# Patient Record
Sex: Female | Born: 1975 | Race: Black or African American | Hispanic: No | Marital: Single | Smoking: Never smoker
Health system: Southern US, Community
[De-identification: ages and names within clinical notes are randomized; demographics above are authoritative.]

## PROBLEM LIST (undated history)

## (undated) DIAGNOSIS — M549 Dorsalgia, unspecified: Secondary | ICD-10-CM

## (undated) DIAGNOSIS — C801 Malignant (primary) neoplasm, unspecified: Secondary | ICD-10-CM

## (undated) HISTORY — PX: LUMBAR DISC SURGERY: SHX700

## (undated) HISTORY — DX: Dorsalgia, unspecified: M54.9

## (undated) HISTORY — DX: Malignant (primary) neoplasm, unspecified: C80.1

## (undated) HISTORY — PX: PATELLAR TENDON REPAIR: SHX737

---

## 2018-05-26 ENCOUNTER — Encounter: Payer: Self-pay | Admitting: Physician Assistant

## 2018-05-26 ENCOUNTER — Ambulatory Visit (INDEPENDENT_AMBULATORY_CARE_PROVIDER_SITE_OTHER): Payer: BC Managed Care – PPO | Admitting: Physician Assistant

## 2018-05-26 VITALS — BP 114/76 | HR 54 | Wt 180.0 lb

## 2018-05-26 DIAGNOSIS — R300 Dysuria: Secondary | ICD-10-CM | POA: Diagnosis not present

## 2018-05-26 DIAGNOSIS — Z7689 Persons encountering health services in other specified circumstances: Secondary | ICD-10-CM | POA: Diagnosis not present

## 2018-05-26 DIAGNOSIS — Z111 Encounter for screening for respiratory tuberculosis: Secondary | ICD-10-CM

## 2018-05-26 DIAGNOSIS — N760 Acute vaginitis: Secondary | ICD-10-CM | POA: Diagnosis not present

## 2018-05-26 DIAGNOSIS — Z0000 Encounter for general adult medical examination without abnormal findings: Secondary | ICD-10-CM | POA: Diagnosis not present

## 2018-05-26 MED ORDER — METRONIDAZOLE 500 MG PO TABS: 500.0000 mg | ORAL_TABLET | Freq: Two times a day (BID) | ORAL | 0 refills | Status: AC

## 2018-05-26 MED ORDER — FLUCONAZOLE 150 MG PO TABS: 150.0000 mg | ORAL_TABLET | Freq: Once | ORAL | 0 refills | Status: AC

## 2018-05-26 MED ORDER — PHENAZOPYRIDINE HCL 200 MG PO TABS: 200.0000 mg | ORAL_TABLET | Freq: Three times a day (TID) | ORAL | 0 refills | Status: DC | PRN

## 2018-05-26 MED ORDER — FLUCONAZOLE 150 MG PO TABS: 150.0000 mg | ORAL_TABLET | Freq: Once | ORAL | 0 refills | Status: DC

## 2018-05-26 MED ORDER — METRONIDAZOLE 500 MG PO TABS: 500.0000 mg | ORAL_TABLET | Freq: Two times a day (BID) | ORAL | 0 refills | Status: DC

## 2018-05-26 NOTE — Patient Instructions (Signed)

## 2018-05-26 NOTE — Progress Notes (Signed)
HPI:                                                                Tammie Jones is a 42 y.o. female who presents to [facility]: Primary Care Sports Medicine today to establish care  Current Concerns include: vaginal discharge, employment physical  C/o malodorous vaginal discharge for several weeks. After self-swabbing in the bathroom today, she developed sudden onset dysuria and urinary urgency.  GYN/Sexual Health  Obstetrics: G0P0  Menstrual status: having periods, irregular  LMP: unkown  Last pap smear: 2 years ago, "normal"  History of abnormal pap smears: no  Sexually active: yes  Current contraception: same sex partner  History of STI: yes  Depression screen PHQ 2/9 05/26/2018  Decreased Interest 0  Down, Depressed, Hopeless 0  PHQ - 2 Score 0    Health Maintenance Health Maintenance  Topic Date Due  . HIV Screening  10/12/1988  . TETANUS/TDAP  10/12/1992  . PAP SMEAR  10/13/1994  . INFLUENZA VACCINE  03/10/2019 (Originally 03/09/2018)    Past Medical History:  Diagnosis Date  . Back pain    Past Surgical History:  Procedure Laterality Date  . LUMBAR DISC SURGERY     x2, L4-L5  . PATELLAR TENDON REPAIR Bilateral    Social History   Tobacco Use  . Smoking status: Never Smoker  . Smokeless tobacco: Never Used  Substance Use Topics  . Alcohol use: Yes    Alcohol/week: 2.0 - 3.0 standard drinks    Types: 2 - 3 Standard drinks or equivalent per week   family history is not on file.  ROS: negative except as noted in the HPI  Medications: No current outpatient medications on file.   No current facility-administered medications for this visit.    Not on File     Objective:  BP 114/76   Pulse (!) 54   Wt 180 lb (81.6 kg)  Gen:  alert, not ill-appearing, no distress, appropriate for age HEENT: head normocephalic without obvious abnormality, conjunctiva and cornea clear, trachea midline Pulm: Normal work of breathing,  normal phonation, clear to auscultation bilaterally, no wheezes, rales or rhonchi CV: bradycardic rate, regular rhythm, s1 and s2 distinct, no murmurs, clicks or rubs  Neuro: alert and oriented x 3, no tremor GU: vulva without rashes or lesions, normal introitus and urethral meatus, vaginal mucosa without erythema, moderate amount of watery clear discharge, cervix non-friable without lesions MSK: extremities atraumatic, normal gait and station Skin: intact, no rashes on exposed skin, no jaundice, no cyanosis Psych: well-groomed, cooperative, good eye contact, euthymic mood, affect mood-congruent, speech is articulate, and thought processes clear and goal-directed   A chaperone was used for the GU portion of the exam, Tammie Jones, CMA.    No results found for this or any previous visit (from the past 72 hour(s)). No results found.    Assessment and Plan: 42 y.o. female with   .Tammie Jones was seen today for establish care.  Diagnoses and all orders for this visit:  Encounter to establish care  Encounter for general health examination  Dysuria -     Discontinue: phenazopyridine (PYRIDIUM) 200 MG tablet; Take 1 tablet (200 mg total) by mouth 3 (three) times daily as needed for pain. -  Discontinue: phenazopyridine (PYRIDIUM) 200 MG tablet; Take 1 tablet (200 mg total) by mouth 3 (three) times daily as needed for pain. -     phenazopyridine (PYRIDIUM) 200 MG tablet; Take 1 tablet (200 mg total) by mouth 3 (three) times daily as needed for pain.  Acute vaginitis -     Discontinue: metroNIDAZOLE (FLAGYL) 500 MG tablet; Take 1 tablet (500 mg total) by mouth 2 (two) times daily for 7 days. -     Discontinue: fluconazole (DIFLUCAN) 150 MG tablet; Take 1 tablet (150 mg total) by mouth once for 1 dose. -     SureSwab, Vaginosis/Vaginitis Plus -     Discontinue: metroNIDAZOLE (FLAGYL) 500 MG tablet; Take 1 tablet (500 mg total) by mouth 2 (two) times daily for 7 days. -     Discontinue:  fluconazole (DIFLUCAN) 150 MG tablet; Take 1 tablet (150 mg total) by mouth once for 1 dose. -     fluconazole (DIFLUCAN) 150 MG tablet; Take 1 tablet (150 mg total) by mouth once for 1 dose. -     metroNIDAZOLE (FLAGYL) 500 MG tablet; Take 1 tablet (500 mg total) by mouth 2 (two) times daily for 7 days.  Screening-pulmonary TB -     TB Skin Test   - Personally reviewed PMH, PSH, PFH, medications, allergies, HM - Age-appropriate cancer screening: Pap UTD per patient - Influenza declined - Tdap UTD - PHQ2 negative - Employment physical completed, form signed and copy left in urgent care to complete TB reading in 48 hours  Dysura: new onset in office today after self-swabbing for vaginitis panel. I suspect she traumatized her urethra with the swab. Pyridium prn. F/u if symptoms persist or worsen  Acute vaginitis: treating empirically for BV/candida with Flagyl pending result of Sureswab vaginitis panel   Patient education and anticipatory guidance given Patient agrees with treatment plan Follow-up as needed  Levonne Hubert PA-C

## 2018-05-28 LAB — TB SKIN TEST
Induration: 0 mm
TB Skin Test: NEGATIVE

## 2018-05-30 ENCOUNTER — Encounter: Payer: Self-pay | Admitting: Physician Assistant

## 2018-05-30 DIAGNOSIS — B9689 Other specified bacterial agents as the cause of diseases classified elsewhere: Secondary | ICD-10-CM | POA: Insufficient documentation

## 2018-05-30 DIAGNOSIS — N760 Acute vaginitis: Secondary | ICD-10-CM

## 2018-05-30 LAB — SURESWAB, VAGINOSIS/VAGINITIS PLUS
ATOPOBIUM VAGINAE: 6.9 Log (cells/mL)
C. ALBICANS, DNA: NOT DETECTED
C. GLABRATA, DNA: NOT DETECTED
C. TRACHOMATIS RNA, TMA: NOT DETECTED
C. parapsilosis, DNA: NOT DETECTED
C. tropicalis, DNA: NOT DETECTED
GARDNERELLA VAGINALIS: 7.4 Log (cells/mL)
LACTOBACILLUS SPECIES: NOT DETECTED
MEGASPHAERA SPECIES: 7.7 Log (cells/mL)
N. gonorrhoeae RNA, TMA: NOT DETECTED
Trichomonas vaginalis RNA: NOT DETECTED

## 2018-06-09 ENCOUNTER — Encounter: Payer: Self-pay | Admitting: Physician Assistant

## 2018-06-09 DIAGNOSIS — Z111 Encounter for screening for respiratory tuberculosis: Secondary | ICD-10-CM | POA: Diagnosis not present

## 2018-10-08 HISTORY — PX: TOTAL ABDOMINAL HYSTERECTOMY: SHX209

## 2019-05-20 ENCOUNTER — Encounter: Payer: Self-pay | Admitting: Emergency Medicine

## 2019-05-20 ENCOUNTER — Emergency Department
Admission: EM | Admit: 2019-05-20 | Discharge: 2019-05-20 | Disposition: A | Discharge status: Home / Self Care | Payer: BC Managed Care – PPO | Source: Home / Self Care | Attending: Emergency Medicine | Admitting: Emergency Medicine

## 2019-05-20 ENCOUNTER — Emergency Department (INDEPENDENT_AMBULATORY_CARE_PROVIDER_SITE_OTHER): Payer: BC Managed Care – PPO

## 2019-05-20 ENCOUNTER — Other Ambulatory Visit: Payer: Self-pay

## 2019-05-20 DIAGNOSIS — M5412 Radiculopathy, cervical region: Secondary | ICD-10-CM | POA: Diagnosis not present

## 2019-05-20 DIAGNOSIS — M542 Cervicalgia: Secondary | ICD-10-CM | POA: Diagnosis not present

## 2019-05-20 DIAGNOSIS — M25512 Pain in left shoulder: Secondary | ICD-10-CM

## 2019-05-20 MED ORDER — IBUPROFEN 600 MG PO TABS
600.0000 mg | ORAL_TABLET | Freq: Once | ORAL | Status: AC
  Administered 2019-05-20: 600 mg via ORAL

## 2019-05-20 MED ORDER — PREDNISONE 20 MG PO TABS: ORAL_TABLET | ORAL | 0 refills | Status: DC

## 2019-05-20 NOTE — ED Triage Notes (Signed)
Here with left shoulder pain that started 4 days ago; progressively getting worse sharp, shooting pain radiating down arm/fingers. Tried gabapentin and Ibuprofen. No injury.

## 2019-05-20 NOTE — Discharge Instructions (Signed)
Take prednisone as instructed. Take Tylenol for breakthrough pain. Increase your gabapentin.  You have 100 mg tablets you can take 2 in the morning and 2 in the mid afternoon and continue 3 at night. Make an appointment to be seen by Dr. Dianah Field. Use a neck pillow for sleeping.

## 2019-05-20 NOTE — ED Provider Notes (Addendum)
[facility]    CSN: [id number] Arrival date & time: 05/20/19  1829      History   Chief Complaint Chief Complaint  Patient presents with  . Shoulder Pain    HPI Tammie Jones is a 43 y.o. female.  Complicated history in that patient presents with severe left-sided neck discomfort with radiation into the low left arm and numbness involving the middle and index  fingers.  Pertinent history reveals the patient has a history of ovarian cancer.  She had hysterectomy and oophorectomy followed by chemotherapy.  She works out regularly at [employer]  She does not know of a specific injury.  Her pain is currently 9 out of 10.  She does have discomfort with any movement of her back she also has tenderness into the superior portion of the left shoulder.  Shoulder Pain   Past Medical History:  Diagnosis Date  . Back pain     Patient Active Problem List   Diagnosis Date Noted  . Bacterial vaginosis 05/30/2018    Past Surgical History:  Procedure Laterality Date  . LUMBAR DISC SURGERY     x2, L4-L5  . PATELLAR TENDON REPAIR Bilateral   . TOTAL ABDOMINAL HYSTERECTOMY  10/2018    OB History   No obstetric history on file.      Home Medications    Prior to Admission medications   Medication Sig Start Date End Date Taking? Authorizing Provider  gabapentin (NEURONTIN) 300 MG capsule Take 300 mg by mouth 3 (three) times daily.   Yes [provider]  sertraline (ZOLOFT) 50 MG tablet Take 50 mg by mouth daily.   Yes [provider]  phenazopyridine (PYRIDIUM) 200 MG tablet Take 1 tablet (200 mg total) by mouth 3 (three) times daily as needed for pain. 05/26/18   Carlis Stable, PA-C  predniSONE (DELTASONE) 20 MG tablet Take 3 PO QAM x3days, 2 PO QAM x3days, 1 PO QAM x3days 05/20/19   Collene Gobble, MD    Family History No family history on file.  Social History Social History   Tobacco Use  . Smoking status: Never Smoker  .  Smokeless tobacco: Never Used  Substance Use Topics  . Alcohol use: Yes    Alcohol/week: 2.0 - 3.0 standard drinks    Types: 2 - 3 Standard drinks or equivalent per week  . Drug use: Never     Allergies   Patient has no known allergies.   Review of Systems Review of Systems  Constitutional: Negative.   HENT: Negative.   Respiratory: Negative.   Musculoskeletal:       She has severe discomfort left side of the neck and into the left shoulder and arm with numbness in the fingers of her left hand  Neurological:       She has numbness in the middle and index finger left hand     Physical Exam Triage Vital Signs ED Triage Vitals  Enc Vitals Group     BP 05/20/19 0837 (!) 145/85     Pulse Rate 05/20/19 0837 (!) 54     Resp --      Temp 05/20/19 0837 98.3 F (36.8 C)     Temp Source 05/20/19 0837 Oral     SpO2 05/20/19 0837 99 %     Weight 05/20/19 0840 164 lb (74.4 kg)     Height 05/20/19 0840 5\' 11"  (1.803 m)     Head Circumference --  Peak Flow --      Pain Score 05/20/19 0838 8     Pain Loc --      Pain Edu? --      Excl. in [place]? --    No data found.  Updated Vital Signs BP (!) 145/85 (BP Location: Left Arm)   Pulse (!) 54   Temp 98.3 F (36.8 C) (Oral)   Ht 5\' 11"  (1.803 m)   Wt 74.4 kg   SpO2 99%   BMI 22.87 kg/m   Visual Acuity Right Eye Distance:   Left Eye Distance:   Bilateral Distance:    Right Eye Near:   Left Eye Near:    Bilateral Near:     Physical Exam Constitutional:      Comments: Patient appears in distress with pain in left side of her neck holding her left arm above her head.  HENT:     Head: Normocephalic.  Neck:     Comments: There is decreased range of motion of the neck.  There is tenderness along the suprascapular area.  There is subjective decreased sensation index and middle finger left hand there is no definite weakness elicited.     UC Treatments / Results  Labs (all labs ordered are listed, but only abnormal  results are displayed) Labs Reviewed - No data to display  EKG   Radiology Dg Cervical Spine Complete  Result Date: 05/20/2019 CLINICAL DATA:  Patient states that for the past 3 days she has had left shoulder pain with pain radiating down her left arm. Denies injury. History of ovarian cancer left arm radiculopathy EXAM: CERVICAL SPINE - COMPLETE 4+ VIEW COMPARISON:  None. FINDINGS: No prevertebral soft tissue swelling. Normal alignment of the vertebral bodies. Normal spinal laminal line. Oblique projections demonstrate no traumatic narrowing of the neural foramina. Open mouth odontoid view demonstrates normal alignment of the lateral masses of C1 on C2. IMPRESSION: Negative cervical spine radiographs. Electronically Signed   By: Suzy Bouchard M.D.   On: 05/20/2019 09:39   Dg Shoulder Left  Result Date: 05/20/2019 CLINICAL DATA:  Acute left shoulder pain without known injury. EXAM: LEFT SHOULDER - 2+ VIEW COMPARISON:  None. FINDINGS: There is no evidence of fracture or dislocation. There is no evidence of arthropathy or other focal bone abnormality. Soft tissues are unremarkable. IMPRESSION: Negative. Electronically Signed   By: Marijo Conception M.D.   On: 05/20/2019 09:35    Procedures Procedures (including critical care time)  Medications Ordered in UC Medications  ibuprofen (ADVIL) tablet 600 mg (600 mg Oral Given 05/20/19 0846)    Initial Impression / Assessment and Plan / UC Course  I have reviewed the triage vital signs and the nursing notes. Patient has symptoms consistent with a left cervical radiculopathy.  Her history of ovarian cancer is concerning.  She does workout in the gym and may have had an injury there.  She was given 600 mg ibuprofen here.  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 Pertinent labs & imaging results that were available during my care of the patient were reviewed by me and considered in my medical decision  making (see chart for details). X-rays did not show any abnormalities we will treat as a cervical radiculopathy with follow-up with the orthopedist she will be treated with increased dose of Neurontin as well as a prednisone Dosepak with Tylenol for breakthrough pain.  Her pressure was up some but I suspect this  is secondary to the pain she is in.  I advised her to make an appointment as soon as possible to see Dr. Benjamin Stainhekkekandam for reevaluation.  She asked if I thought this [facility] related to her flu shot and I told her I felt it was more likely secondary to cervical radiculopathy.     Final Clinical Impressions(s) / UC Diagnoses   Final diagnoses:  Cervical radiculopathy     Discharge Instructions     Take prednisone as instructed. Take Tylenol for breakthrough pain. Increase your gabapentin.  You have 100 mg tablets you can take 2 in the morning and 2 in the mid afternoon and continue 3 at night. Make an appointment to be seen by Dr. Benjamin Stainhekkekandam. Use a neck pillow for sleeping.    ED Prescriptions    Medication Sig Dispense Auth. Provider   predniSONE (DELTASONE) 20 MG tablet Take 3 PO QAM x3days, 2 PO QAM x3days, 1 PO QAM x3days 18 tablet , Maylon Peppers A, MD     PDMP not reviewed this encounter.   Collene Gobbleaub,  A, MD 05/20/19 1002    Collene Gobbleaub,  A, MD 05/20/19 1003

## 2019-05-22 ENCOUNTER — Encounter: Payer: Self-pay | Admitting: Sports Medicine

## 2019-05-22 ENCOUNTER — Other Ambulatory Visit: Payer: Self-pay

## 2019-05-22 ENCOUNTER — Ambulatory Visit (INDEPENDENT_AMBULATORY_CARE_PROVIDER_SITE_OTHER): Payer: BC Managed Care – PPO | Admitting: Sports Medicine

## 2019-05-22 ENCOUNTER — Ambulatory Visit (INDEPENDENT_AMBULATORY_CARE_PROVIDER_SITE_OTHER): Payer: BC Managed Care – PPO

## 2019-05-22 DIAGNOSIS — M5412 Radiculopathy, cervical region: Secondary | ICD-10-CM

## 2019-05-22 MED ORDER — TRAMADOL HCL 50 MG PO TABS: 50.0000 mg | ORAL_TABLET | Freq: Three times a day (TID) | ORAL | 0 refills | Status: DC | PRN

## 2019-05-22 MED ORDER — GABAPENTIN 800 MG PO TABS: ORAL_TABLET | ORAL | 3 refills | Status: DC

## 2019-05-22 MED ORDER — AMITRIPTYLINE HCL 50 MG PO TABS: ORAL_TABLET | ORAL | 3 refills | Status: DC

## 2019-05-22 NOTE — Assessment & Plan Note (Signed)
Left C7 distribution radiculitis, history of ovarian cancer post chemotherapy. For this reason we are going to proceed sooner rather than later with a cervical spine MRI. She is finishing her prednisone, continue gabapentin at night, switching to 800 mg tablets, she can use it up to 3 times daily if needed. Adding amitriptyline at bedtime. Tramadol. Formal physical therapy. Return to see me in 6 weeks.

## 2019-05-22 NOTE — Progress Notes (Signed)
Subjective:    CC: Pain, left arm pain  HPI: This is a very pleasant 43 year old female, she just finished her last chemotherapy session for ovarian cancer, stage I.  Unfortunately she has noted for the past several months pain in her neck with radiation down the left arm to the second and third fingers, better with abduction of the shoulder.  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  I reviewed the past medical history, family history, social history, surgical history, and allergies today and no changes were needed.  Please see the problem list section below in epic for further details.  Past Medical History: Past Medical History:  Diagnosis Date  . Back pain    Past Surgical History: Past Surgical History:  Procedure Laterality Date  . LUMBAR DISC SURGERY     x2, L4-L5  . PATELLAR TENDON REPAIR Bilateral   . TOTAL ABDOMINAL HYSTERECTOMY  10/2018   Social History: Social History   Socioeconomic History  . Marital status: Single    Spouse name: Not on file  . Number of children: Not on file  . Years of education: Not on file  . Highest education level: Not on file  Occupational History  . Not on file  Social Needs  . Financial resource strain: Not on file  . Food insecurity    Worry: Not on file    Inability: Not on file  . Transportation needs    Medical: Not on file    Non-medical: Not on file  Tobacco Use  . Smoking status: Never Smoker  . Smokeless tobacco: Never Used  Substance and Sexual Activity  . Alcohol use: Yes    Alcohol/week: 2.0 - 3.0 standard drinks    Types: 2 - 3 Standard drinks or equivalent per week  . Drug use: Never  . Sexual activity: Yes    Birth control/protection: Other-see comments    Comment: same sex partner  Lifestyle  . Physical activity    Days per week: Not on file    Minutes per session: Not on file  . Stress: Not on file   Relationships  . Social Musician on phone: Not on file    Gets together: Not on file    Attends religious service: Not on file    Active member of club or organization: Not on file    Attends meetings of clubs or organizations: Not on file    Relationship status: Not on file  Other Topics Concern  . Not on file  Social History Narrative  . Not on file   Family History: No family history on file. Allergies: No Known Allergies Medications: See med rec.  Review of Systems: No fevers, chills, night sweats, weight loss, chest pain, or shortness of breath.   Objective:    General: Well Developed, well nourished, and in no acute distress.  Neuro: Alert and oriented x3, extra-ocular muscles intact, sensation grossly intact.  HEENT: Normocephalic, atraumatic, pupils equal round reactive to light, neck supple, no masses, no lymphadenopathy, thyroid nonpalpable.  Skin: Warm and dry, no rashes. Cardiac: Regular rate and rhythm, no murmurs rubs or gallops, no lower extremity edema.  Respiratory: Clear to auscultation bilaterally. Not using accessory muscles, speaking in full sentences. Neck: Negative spurling's Full neck range of motion Grip strength and sensation normal in bilateral hands Strength good C4 to T1 distribution No sensory change to C4 to T1 Reflexes  normal  Impression and Recommendations:    Radiculitis of left cervical region Left C7 distribution radiculitis, history of ovarian cancer post chemotherapy. For this reason we are going to proceed sooner rather than later with a cervical spine MRI. She is finishing her prednisone, continue gabapentin at night, switching to 800 mg tablets, she can use it up to 3 times daily if needed. Adding amitriptyline at bedtime. Tramadol. Formal physical therapy. Return to see me in 6 weeks.   ___________________________________________ Gwen Her. Dianah Field, M.D., ABFM., CAQSM. Primary Care and Sports Medicine [facility]  Adjunct Professor of [employer]

## 2019-05-29 ENCOUNTER — Other Ambulatory Visit: Payer: Self-pay

## 2019-05-29 ENCOUNTER — Encounter: Payer: Self-pay | Admitting: Physical Therapy

## 2019-05-29 ENCOUNTER — Ambulatory Visit (INDEPENDENT_AMBULATORY_CARE_PROVIDER_SITE_OTHER): Payer: BC Managed Care – PPO | Admitting: Physical Therapy

## 2019-05-29 DIAGNOSIS — R293 Abnormal posture: Secondary | ICD-10-CM

## 2019-05-29 DIAGNOSIS — M5412 Radiculopathy, cervical region: Secondary | ICD-10-CM | POA: Diagnosis not present

## 2019-05-29 NOTE — Patient Instructions (Signed)
Access Code: [email or web address]  URL: [email or web address]  Date: 05/29/2019  Prepared by: Faustino Congress   Exercises  Seated Upper Trapezius Stretch - 3 reps - 1 sets - 30 sec hold - 2x daily - 7x weekly  Seated Levator Scapulae Stretch - 1 reps - 1 sets - 30 sec hold - 2x daily - 7x weekly  Seated Cervical Retraction and Extension - 10 reps - 1 sets - 5-10 sec hold - 3x daily - 7x weekly  Patient Education  Trigger Point Dry Needling

## 2019-05-29 NOTE — Therapy (Signed)
[facility] [address] [place], [place], [address] Phone: [phone]   Fax:  [phone]  Physical Therapy Evaluation  Patient Details  Name: Tammie Jones MRN: [id number] Date of Birth: 10/19/1975 Referring Provider (PT): Monica Bectonhekkekandam, Thomas J, MD   Encounter Date: 05/29/2019  PT End of Session - 05/29/19 1245    Visit Number  1    Number of Visits  12    Date for PT Re-Evaluation  07/10/19    PT Start Time  1100    PT Stop Time  1144    PT Time Calculation (min)  44 min    Activity Tolerance  Patient tolerated treatment well    Behavior During Therapy  [facility] for [facility] assessed/performed       Past Medical History:  Diagnosis Date  . Back pain     Past Surgical History:  Procedure Laterality Date  . LUMBAR DISC SURGERY     x2, L4-L5  . PATELLAR TENDON REPAIR Bilateral   . TOTAL ABDOMINAL HYSTERECTOMY  10/2018    There were no vitals filed for this visit.   Subjective Assessment - 05/29/19 1103    Subjective  Pt is a 43 y/o female who presents to OPPT for Lt sided neck and shoulder pain x 1.5 weeks.  Pt states she got flu shot on Monday; and symptoms developed following.  Pt reports she developed progressive pain into LUE with tingling into Lt fingers.  Pt also feels Rt shoulder/neck has started to develop symptoms as well.    Patient Stated Goals  return to working out, be able to feel index and middle fingers again    Currently in Pain?  Yes    Pain Score  4    up to 10/10; at best 0/10   Pain Location  Neck    Pain Orientation  Left    Pain Descriptors / Indicators  Constant;Radiating;Numbness;Spasm;Tightness    Pain Type  Acute pain    Pain Radiating Towards  arm, forearm, fingers    Pain Onset  1 to 4 weeks ago    Pain Frequency  Constant    Aggravating Factors   unknown; turning to Rt    Pain Relieving Factors  rest, staying stationary         [facility] - 05/29/19 1109      Assessment   Medical Diagnosis  M54.12 (ICD-10-CM) - Radiculitis of left cervical region    Referring Provider (PT)  Monica Bectonhekkekandam, Thomas J, MD    Onset Date/Surgical Date  05/17/19    Hand Dominance  Right    Next MD Visit  07/03/2019    Prior Therapy  for low back (2 back surgeries)      Precautions   Precautions  None      Restrictions   Weight Bearing Restrictions  No      Balance Screen   Has the patient fallen in the past 6 months  No    Has the patient had a decrease in activity level because of a fear of falling?   No    Is the patient reluctant to leave their home because of a fear of falling?   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)    Leisure  exercise (lifting, biking, walking); Engineer, structural      Cognition   Overall Cognitive Status  Within Functional Limits for tasks assessed      Posture/Postural Control   Posture/Postural Control  Postural limitations    Postural Limitations  Rounded Shoulders;Forward head      ROM / Strength   AROM / PROM / Strength  AROM;Strength      AROM   AROM Assessment Site  Cervical    Cervical Flexion  37   with pain   Cervical Extension  39    Cervical - Right Side Bend  38   with radiating pain LUE   Cervical - Left Side Bend  26   pain worse than right   Cervical - Right Rotation  75    Cervical - Left Rotation  56   with pain     Strength   Strength Assessment Site  Shoulder    Right/Left Shoulder  Right;Left    Right Shoulder Flexion  5/5    Right Shoulder ABduction  5/5    Right Shoulder Internal Rotation  5/5    Right Shoulder External Rotation  5/5    Left Shoulder Flexion  3+/5    Left Shoulder ABduction  4/5    Left Shoulder Internal Rotation  5/5    Left Shoulder  External Rotation  5/5    Right/Left Elbow  --      Palpation   Palpation comment  active trigger points Lt levator scapula/upper trap      Special Tests    Special Tests  Cervical    Cervical Tests  Spurling's;Dictraction      Spurling's   Findings  Positive    Side  Left      Distraction Test   Findngs  Negative                Objective measurements completed on examination: See above findings.      [facility] Adult PT Treatment/Exercise - 05/29/19 1109      Self-Care   Self-Care  Other Self-Care Comments    Other Self-Care Comments   instructed in HEP: upper trap/levator scapula stretch; and cervical retraction/extension x 10 reps      Manual Therapy   Manual Therapy  Soft tissue mobilization    Soft tissue mobilization  STM to bil upper traps into levator scapulae       Trigger Point Dry Needling - 05/29/19 1244    Consent Given?  Yes    Education Handout Provided  Yes    Muscles Treated Head and Neck  Upper trapezius;Levator scapulae    Upper Trapezius Response  Twitch reponse elicited;Palpable increased muscle length    Levator Scapulae Response  Twitch response elicited;Palpable increased muscle length           PT Education - 05/29/19 1245    Education Details  HEP, DN    Person(s) Educated  Patient    Methods  Explanation;Demonstration;Handout    Comprehension  Verbalized understanding;Returned demonstration;Need further instruction          PT Long Term Goals - 05/29/19 1252      PT LONG TERM GOAL #1   Title  independent with HEP    Status  New    Target Date  07/10/19      PT LONG TERM GOAL #2   Title  improve Lt cervical  rotation to at least 65 degrees without pain for improved function    Status  New    Target Date  07/10/19      PT LONG TERM GOAL #3   Title  demonstrate Lt shoulder flexion 5/5 for improved strength and function    Status  New    Target Date  07/10/19      PT LONG TERM GOAL #4   Title  report pain < 4/10  with activity for improved function    Status  New    Target Date  07/10/19             Plan - 05/29/19 1245    Clinical Impression Statement  Pt is a 43 y/o female who presents to OPPT for acute onset of Lt sided neck and shoulder pain.  Pt demonstrates postural abnormalities, decreased strength, and active trigger points affecting functional mobility.  Pt will benefit from PT to address deficits listed.    Personal Factors and Comorbidities  Comorbidity 2    Comorbidities  lumbar disc surgery x 2; abdominal hysterectomy (just completed tx for ovarian cancer)    Examination-Activity Limitations  Lift;Reach Overhead    Examination-Participation Restrictions  Driving;Other   exercise   Stability/Clinical Decision Making  Evolving/Moderate complexity    Clinical Decision Making  Moderate    Rehab Potential  Good    PT Frequency  2x / week    PT Duration  6 weeks    PT Treatment/Interventions  ADLs/Self Care Home Management;Cryotherapy;Ultrasound;Traction;Moist Heat;Iontophoresis 4mg /ml Dexamethasone;Functional mobility training;Therapeutic activities;Therapeutic exercise;Patient/family education;Manual techniques;Passive range of motion;Taping;Dry needling    PT Next Visit Plan  review HEP, assess response to DN, continue manual/DN (no estim-recent cancer), posture exercises; needs FOTO with goal written    PT Home Exercise Plan  Access Code: [email or web address]       Patient will benefit from skilled therapeutic intervention in order to improve the following deficits and impairments:  Decreased range of motion, Increased fascial restricitons, Increased muscle spasms, Pain, Postural dysfunction, Decreased strength  Visit Diagnosis: Radiculopathy, cervical region - Plan: PT plan of care cert/re-cert  Abnormal posture - Plan: PT plan of care cert/re-cert     Problem List Patient Active Problem List   Diagnosis Date Noted  . Radiculitis of left cervical region 05/22/2019  . Bacterial  vaginosis 05/30/2018      06/01/2018, PT, DPT 05/29/19 12:55 PM      [facility] [address] [place] [address] [place], [place], [place] Phone: [phone]   Fax:  [phone]  Name: Tammie Jones MRN: Tammie Jones Date of Birth: 15-Aug-1975

## 2019-05-31 ENCOUNTER — Ambulatory Visit (INDEPENDENT_AMBULATORY_CARE_PROVIDER_SITE_OTHER): Payer: BC Managed Care – PPO | Admitting: Physical Therapy

## 2019-05-31 ENCOUNTER — Other Ambulatory Visit: Payer: Self-pay

## 2019-05-31 ENCOUNTER — Encounter: Payer: Self-pay | Admitting: Physical Therapy

## 2019-05-31 DIAGNOSIS — M5412 Radiculopathy, cervical region: Secondary | ICD-10-CM

## 2019-05-31 DIAGNOSIS — R293 Abnormal posture: Secondary | ICD-10-CM | POA: Diagnosis not present

## 2019-05-31 NOTE — Therapy (Signed)
[place], [place], [address] Phone: [phone]   Fax:  [phone]  Physical Therapy Treatment  Patient Details  Name: Tammie Jones MRN: [id number] Date of Birth: 1976/06/07 Referring Provider (PT): Silverio Decamp, MD   Encounter Date: 05/31/2019  PT End of Session - 05/31/19 1143    Visit Number  2    Number of Visits  12    Date for PT Re-Evaluation  07/10/19    PT Start Time  1100    PT Stop Time  1140    PT Time Calculation (min)  40 min    Activity Tolerance  Patient tolerated treatment well    Behavior During Therapy  [facility] for tasks assessed/performed       Past Medical History:  Diagnosis Date  . Back pain     Past Surgical History:  Procedure Laterality Date  . LUMBAR DISC SURGERY     x2, L4-L5  . PATELLAR TENDON REPAIR Bilateral   . TOTAL ABDOMINAL HYSTERECTOMY  10/2018    There were no vitals filed for this visit.  Subjective Assessment - 05/31/19 1105    Subjective  symptoms eased up after first session but then started to return as activity increased.    Patient Stated Goals  return to working out, be able to feel index and middle fingers again    Currently in Pain?  Yes    Pain Score  2     Pain Location  Arm    Pain Orientation  Left    Pain Descriptors / Indicators  Radiating    Pain Type  Acute pain    Pain Onset  1 to 4 weeks ago    Pain Frequency  Constant    Aggravating Factors   turning to Rt    Pain Relieving Factors  rest, staying stationary                       [facility] Adult PT Treatment/Exercise - 05/31/19 1107      Exercises   Exercises  Neck      Neck Exercises: Machines for Strengthening   UBE (Upper Arm Bike)  L4 x 4 min (2' each direction)      Neck Exercises: Seated   Neck Retraction  10 reps;10 secs    W Back  10 reps   10 sec     Manual Therapy   Manual Therapy  Soft tissue mobilization    Soft tissue  mobilization  STM to bil upper traps into levator scapulae, cervical paraspinals      Neck Exercises: Stretches   Upper Trapezius Stretch  Left;1 rep;30 seconds    Levator Stretch  Left;1 rep;30 seconds       Trigger Point Dry Needling - 05/31/19 1142    Consent Given?  Yes    Education Handout Provided  Previously provided    Muscles Treated Head and Neck  Upper trapezius;Levator scapulae;Splenius capitus;Semispinalis capitus;Cervical multifidi    Upper Trapezius Response  Twitch reponse elicited;Palpable increased muscle length    Levator Scapulae Response  Twitch response elicited;Palpable increased muscle length    Splenius capitus Response  Twitch reponse elicited;Palpable increased muscle length    Semispinalis capitus Response  Twitch reponse elicited;Palpable increased muscle length    Cervical multifidi Response  Twitch reponse elicited;Palpable increased muscle length                PT Long Term Goals -  05/29/19 1252      PT LONG TERM GOAL #1   Title  independent with HEP    Status  New    Target Date  07/10/19      PT LONG TERM GOAL #2   Title  improve Lt cervical rotation to at least 65 degrees without pain for improved function    Status  New    Target Date  07/10/19      PT LONG TERM GOAL #3   Title  demonstrate Lt shoulder flexion 5/5 for improved strength and function    Status  New    Target Date  07/10/19      PT LONG TERM GOAL #4   Title  report pain < 4/10 with activity for improved function    Status  New    Target Date  07/10/19            Plan - 05/31/19 1143    Clinical Impression Statement  Pt tolerated session well today with decreased numbness following DN and manual therapy today.  Also discussed posterior shoulder/upper back strengthening exercises for home with weights.  Pt verbalized understanding.    Personal Factors and Comorbidities  Comorbidity 2    Comorbidities  lumbar disc surgery x 2; abdominal hysterectomy (just  completed tx for ovarian cancer)    Examination-Activity Limitations  Lift;Reach Overhead    Examination-Participation Restrictions  Driving;Other   exercise   Stability/Clinical Decision Making  Evolving/Moderate complexity    Rehab Potential  Good    PT Frequency  2x / week    PT Duration  6 weeks    PT Treatment/Interventions  ADLs/Self Care Home Management;Cryotherapy;Ultrasound;Traction;Moist Heat;Iontophoresis 4mg /ml Dexamethasone;Functional mobility training;Therapeutic activities;Therapeutic exercise;Patient/family education;Manual techniques;Passive range of motion;Taping;Dry needling    PT Next Visit Plan  review HEP, assess response to DN, continue manual/DN (no estim-recent cancer), posture exercises; needs FOTO with goal written    PT Home Exercise Plan  Access Code: [email or web address]       Patient will benefit from skilled therapeutic intervention in order to improve the following deficits and impairments:  Decreased range of motion, Increased fascial restricitons, Increased muscle spasms, Pain, Postural dysfunction, Decreased strength  Visit Diagnosis: Radiculopathy, cervical region  Abnormal posture     Problem List Patient Active Problem List   Diagnosis Date Noted  . Radiculitis of left cervical region 05/22/2019  . Bacterial vaginosis 05/30/2018      06/01/2018, PT, DPT 05/31/19 11:45 AM     [facility] [address] [place] [address] [place], [place], [place] Phone: [phone]   Fax:  [phone]  Name: Tammie Jones MRN: Tammie Jones Date of Birth: 03/08/1976

## 2019-06-05 ENCOUNTER — Other Ambulatory Visit: Payer: Self-pay

## 2019-06-05 ENCOUNTER — Ambulatory Visit (INDEPENDENT_AMBULATORY_CARE_PROVIDER_SITE_OTHER): Payer: BC Managed Care – PPO | Admitting: Physical Therapy

## 2019-06-05 DIAGNOSIS — R293 Abnormal posture: Secondary | ICD-10-CM

## 2019-06-05 DIAGNOSIS — M5412 Radiculopathy, cervical region: Secondary | ICD-10-CM

## 2019-06-05 NOTE — Therapy (Signed)
[facility] [place] [address] [place] [address] [place], [place], [address] Phone: [phone]   Fax:  [phone]  Physical Therapy Treatment  Patient Details  Name: Tammie Jones MRN: [id number] Date of Birth: 06/26/1976 Referring Provider (PT): Monica Bectonhekkekandam, Thomas J, MD   Encounter Date: 06/05/2019  PT End of Session - 06/05/19 1238    Visit Number  3    Number of Visits  12    Date for PT Re-Evaluation  07/10/19    PT Start Time  1150    PT Stop Time  1234    PT Time Calculation (min)  44 min    Behavior During Therapy  [facility] for [facility] assessed/performed       Past Medical History:  Diagnosis Date  . Back pain     Past Surgical History:  Procedure Laterality Date  . LUMBAR DISC SURGERY     x2, L4-L5  . PATELLAR TENDON REPAIR Bilateral   . TOTAL ABDOMINAL HYSTERECTOMY  10/2018    There were no vitals filed for this visit.  Subjective Assessment - 06/05/19 1252    Subjective  Pt reports she had relief of tingling in Lt hand for a few days following last session; symptoms returned after 3 days.  She tried to show her neice an incline push up and she couldn't do it; her Lt shoulder just felt weak.    Patient Stated Goals  return to working out, be able to feel index and middle fingers again    Currently in Pain?  Yes    Pain Score  2     Pain Location  Neck   upper trap   Pain Orientation  Left    Pain Descriptors / Indicators  Aching;Nagging    Pain Radiating Towards  down arm and into index and middle L finger    Aggravating Factors   turning head Lt    Pain Relieving Factors  rest.         [facility] PT Assessment - 06/05/19 0001      Assessment   Medical Diagnosis  M54.12 (ICD-10-CM) - Radiculitis of left cervical region    Referring Provider (PT)  Monica Bectonhekkekandam, Thomas J, MD    Onset Date/Surgical Date  05/17/19    Hand Dominance  Right    Next MD Visit  07/03/2019    Prior Therapy  for low back (2 back surgeries)      AROM   Cervical Flexion  57    Cervical Extension  60    Cervical - Right Side Bend  40    Cervical - Left Side Bend  34        [facility] Adult PT Treatment/Exercise - 06/05/19 0001      Self-Care   Self-Care  Other Self-Care Comments    Other Self-Care Comments   Pt instructed in self massage for Lt pec, upper trap and posterior shoulder girdle with ball; pt returned demo with cues.       Neck Exercises: Machines for Strengthening   UBE (Upper Arm Bike)  L3: 1 min each direction       Neck Exercises: Standing   Other Standing Exercises  scap retraction x 5 sec x 5 reps, mirror for feedback on posture and symmetry of shoulders.       Neck Exercises: Supine   Other Supine Exercise  median nerve glides for LUE x 10     Other Supine Exercise  Lt arm snow angel x 5 (with prolonged  stretch ~120 deg)       Manual Therapy   Manual Therapy  Soft tissue mobilization    Soft tissue mobilization  STM to Lt pec, upper trap, and bilat cervical paraspinals (reproduced symptoms into UE) - performed in supine;  switched to seated position and IASTM to Lt UT, scalenes, levator scapulae, pec, posterior shoulder      Neck Exercises: Stretches   Upper Trapezius Stretch  --   verbally reviewed.    Other Neck Stretches  3 position doorway stretch x 3 reps of 15 sec; bilat bicep stretch holding door frame x 2 reps of 20 sec;  Lt tricep stretch x 20 sec              PT Education - 06/05/19 1314    Education Details  HEP    Person(s) Educated  Patient    Methods  Explanation;Demonstration;Handout;Verbal cues    Comprehension  Returned demonstration;Verbalized understanding          PT Long Term Goals - 05/29/19 1252      PT LONG TERM GOAL #1   Title  independent with HEP    Status  New    Target Date  07/10/19      PT LONG TERM GOAL #2   Title  improve Lt cervical rotation to at least 65 degrees without pain for improved function    Status  New    Target Date  07/10/19      PT LONG  TERM GOAL #3   Title  demonstrate Lt shoulder flexion 5/5 for improved strength and function    Status  New    Target Date  07/10/19      PT LONG TERM GOAL #4   Title  report pain < 4/10 with activity for improved function    Status  New    Target Date  07/10/19            Plan - 06/05/19 1240    Clinical Impression Statement  Pt had positive response to DN last session, but radicular symptoms returned 3 days later.  Pt reported mild decrease in symptoms in hand after doorway stretch.  Trial at [facility] to [facility], cervical paraspinals and scalenes reproduced and increased symptoms into Lt hand.  Improved tolerance for IASTM to upper trap, lower cervical paraspinals, and pec.  Pt reported reduction of neck pain after IASTM.  Cervical ROM improved since eval.  Goals are ongoing.    Personal Factors and Comorbidities  Comorbidity 2    Comorbidities  lumbar disc surgery x 2; abdominal hysterectomy (just completed tx for ovarian cancer)    Examination-Activity Limitations  Lift;Reach Overhead    Examination-Participation Restrictions  Driving;Other   exercise   Stability/Clinical Decision Making  Evolving/Moderate complexity    Rehab Potential  Good    PT Frequency  2x / week    PT Duration  6 weeks    PT Treatment/Interventions  ADLs/Self Care Home Management;Cryotherapy;Ultrasound;Traction;Moist Heat;Iontophoresis 4mg /ml Dexamethasone;Functional mobility training;Therapeutic activities;Therapeutic exercise;Patient/family education;Manual techniques;Passive range of motion;Taping;Dry needling    PT Next Visit Plan  continue manual/DN (no estim-recent cancer), continue posture strengthening exercises; needs FOTO with goal written    PT Home Exercise Plan  Access Code: [email or web address]    Consulted and Agree with Plan of Care  Patient       Patient will benefit from skilled therapeutic intervention in order to improve the following deficits and impairments:  Decreased range of motion, Increased  fascial restricitons, Increased  muscle spasms, Pain, Postural dysfunction, Decreased strength  Visit Diagnosis: Radiculopathy, cervical region  Abnormal posture     Problem List Patient Active Problem List   Diagnosis Date Noted  . Radiculitis of left cervical region 05/22/2019  . Bacterial vaginosis 05/30/2018   Kerin Perna, PTA 06/05/19 2:01 PM  [place], [place], [address] Phone: [phone]   Fax:  [phone]  Name: Tammie Jones MRN: [id number] Date of Birth: [birth year]

## 2019-06-05 NOTE — Patient Instructions (Addendum)
Neurovascular: Median Nerve Glide With Elbow Bias - Supine    Lie with neck supported. Hold right arm out to side, elbow bent, thumb down, fingers and wrist bent back. Slowly straighten elbow as far as possible without pain. Repeat __10__ times per set. Do ___1 sets per session. Do __5__ sessions per week.  Access Code: [email or web address]  URL: [email or web address]  Date: 06/05/2019  Prepared by: Kerin Perna   Exercises  Added: Doorway Pec Stretch at 90 Degrees Abduction - 5 reps - 1 sets - 2x daily - 7x weekly  Doorway Pec Stretch at 120 Degrees Abduction - 5 reps - 1 sets - 15-20 hold - 2x daily - 7x weekly  Standing Scapular Retraction - 5 reps - 1 sets - 5-10 hold - 3x daily - 7x weekly

## 2019-06-07 ENCOUNTER — Encounter: Payer: BC Managed Care – PPO | Admitting: Physical Therapy

## 2019-06-08 ENCOUNTER — Encounter: Payer: Self-pay | Admitting: Rehabilitative and Restorative Service Providers"

## 2019-06-08 ENCOUNTER — Other Ambulatory Visit: Payer: Self-pay

## 2019-06-08 ENCOUNTER — Ambulatory Visit (INDEPENDENT_AMBULATORY_CARE_PROVIDER_SITE_OTHER): Payer: BC Managed Care – PPO | Admitting: Rehabilitative and Restorative Service Providers"

## 2019-06-08 DIAGNOSIS — R293 Abnormal posture: Secondary | ICD-10-CM | POA: Diagnosis not present

## 2019-06-08 DIAGNOSIS — M5412 Radiculopathy, cervical region: Secondary | ICD-10-CM | POA: Diagnosis not present

## 2019-06-08 NOTE — Therapy (Signed)
[facility] [address] [place], [place], [address] Phone: [phone]   Fax:  [phone]  Physical Therapy Treatment  Patient Details  Name: Tammie Jones MRN: [id number] Date of Birth: 10/05/1975 Referring Provider (PT): Monica Bectonhekkekandam, Thomas J, MD   Encounter Date: 06/08/2019  PT End of Session - 06/08/19 1049    Visit Number  4    Number of Visits  12    Date for PT Re-Evaluation  07/10/19    PT Start Time  1022    PT Stop Time  1110    PT Time Calculation (min)  48 min    Behavior During Therapy  [facility] for [facility] assessed/performed       Past Medical History:  Diagnosis Date  . Back pain     Past Surgical History:  Procedure Laterality Date  . LUMBAR DISC SURGERY     x2, L4-L5  . PATELLAR TENDON REPAIR Bilateral   . TOTAL ABDOMINAL HYSTERECTOMY  10/2018    There were no vitals filed for this visit.  Subjective Assessment - 06/08/19 1024    Subjective  The patient reports on Wednesday her pain worsened into her left arm.  She reports things felt really good after leaving here after her last session.  Due to feeling good, she was able to pick her niece up, which seems to have aggravated her symptoms.    Pertinent History  h/o ovarian cancer    Patient Stated Goals  return to working out, be able to feel index and middle fingers again    Currently in Pain?  Yes    Pain Score  5     Pain Location  Neck    Pain Orientation  Left    Pain Descriptors / Indicators  Aching;Nagging    Pain Type  Acute pain    Pain Radiating Towards  arm into forearm (not into fingers today)    Pain Onset  More than a month ago    Pain Frequency  Constant    Aggravating Factors   working out, turning head left                       [facility] Adult PT Treatment/Exercise - 06/08/19 1339      Exercises   Exercises  Neck      Neck Exercises: Sidelying   Other Sidelying Exercise  PT combined sidelying (head  supported) neck retraction with L UE reaching to floor (positioned with L shoulder / UE off top edge of mat table.  Used this for neck stabilization with active nerve glide.      Neck Exercises: Prone   Neck Retraction  5 reps    Neck Retraction Limitations  prone on elbows with tactile cues for technique      Modalities   Modalities  Cryotherapy      Cryotherapy   Number Minutes Cryotherapy  12 Minutes    Cryotherapy Location  Cervical;Shoulder   left   Type of Cryotherapy  Ice pack      Manual Therapy   Manual Therapy  Joint mobilization;Soft tissue mobilization;Manual Traction;Passive ROM;Scapular mobilization    Manual therapy comments  For goal of pain reduction, centralization of radiating symptoms, improving flexibility.    Joint Mobilization  sidelying:  Grade I and Grade II lateral mid cervical glides, Supine: grade I P>A mobs mid c- spine, Prone: grade I and II P>A upper thoracic mobilization     Soft tissue mobilization  STM to L scalenes, paraspinals in mid, lower, and upper thoracic spine, L pectoralis musculature.  STM at insertion of levator on L scapula and along ligaments at tranverse process T1    Scapular Mobilization  Sidelyign L scapular mobilization moving into protraction/retraction; adding STM in sidelying to upper trap L side using contract/relax for reduced tightness    Passive ROM  PROM improved after STM and gentle manual traction to left rotation without radiating symptoms    Manual Traction  gentle manual traction supine             PT Education - 06/08/19 1338    Education Details  Recommended use of ice for muscle soreness    Person(s) Educated  Patient    Methods  Explanation    Comprehension  Verbalized understanding          PT Long Term Goals - 05/29/19 1252      PT LONG TERM GOAL #1   Title  independent with HEP    Status  New    Target Date  07/10/19      PT LONG TERM GOAL #2   Title  improve Lt cervical rotation to at least 65  degrees without pain for improved function    Status  New    Target Date  07/10/19      PT LONG TERM GOAL #3   Title  demonstrate Lt shoulder flexion 5/5 for improved strength and function    Status  New    Target Date  07/10/19      PT LONG TERM GOAL #4   Title  report pain < 4/10 with activity for improved function    Status  New    Target Date  07/10/19            Plan - 06/08/19 1101    Clinical Impression Statement  The patient had trigger point at L levator and was point tender along musculature at transverse process T1.  She had an increase in pain with trigger point release along parascapular muscles and upper thoracic paraspinal musculature.  She had a decrease in radiating symptoms.  PT ended with ice to reduce inflammation.  She was encouraged to ice later today to reduce muscle soreness.  Ended session with 3/10 pain and no radiating symptoms.    Personal Factors and Comorbidities  Comorbidity 2    Comorbidities  lumbar disc surgery x 2; abdominal hysterectomy (just completed tx for ovarian cancer)    Examination-Activity Limitations  Lift;Reach Overhead    Examination-Participation Restrictions  Driving;Other   exercise   Stability/Clinical Decision Making  Evolving/Moderate complexity    Rehab Potential  Good    PT Frequency  2x / week    PT Duration  6 weeks    PT Treatment/Interventions  ADLs/Self Care Home Management;Cryotherapy;Ultrasound;Traction;Moist Heat;Iontophoresis 4mg /ml Dexamethasone;Functional mobility training;Therapeutic activities;Therapeutic exercise;Patient/family education;Manual techniques;Passive range of motion;Taping;Dry needling    PT Next Visit Plan  continue manual/DN (no estim-recent cancer), continue posture strengthening exercises    PT Home Exercise Plan  Access Code: [email or web address]    Consulted and Agree with Plan of Care  Patient       Patient will benefit from skilled therapeutic intervention in order to improve the following deficits  and impairments:  Decreased range of motion, Increased fascial restricitons, Increased muscle spasms, Pain, Postural dysfunction, Decreased strength  Visit Diagnosis: Radiculopathy, cervical region  Abnormal posture     Problem List Patient Active Problem List   Diagnosis Date Noted  .  Radiculitis of left cervical region 05/22/2019  . Bacterial vaginosis 05/30/2018    Oral Hallgren, PT 06/08/2019, 1:47 PM  [facility] [address] [place], [place], [address] Phone: [phone]   Fax:  [phone]  Name: Tammie Jones MRN: [id number] Date of Birth: [birth year]

## 2019-06-11 ENCOUNTER — Ambulatory Visit (INDEPENDENT_AMBULATORY_CARE_PROVIDER_SITE_OTHER): Payer: BC Managed Care – PPO | Admitting: Physical Therapy

## 2019-06-11 ENCOUNTER — Encounter: Payer: Self-pay | Admitting: Physical Therapy

## 2019-06-11 ENCOUNTER — Other Ambulatory Visit: Payer: Self-pay

## 2019-06-11 DIAGNOSIS — M5412 Radiculopathy, cervical region: Secondary | ICD-10-CM | POA: Diagnosis not present

## 2019-06-11 DIAGNOSIS — R293 Abnormal posture: Secondary | ICD-10-CM

## 2019-06-11 NOTE — Therapy (Signed)
[place], [place], [address] Phone: [phone]   Fax:  [phone]  Physical Therapy Treatment  Patient Details  Name: Tammie Jones MRN: [id number] Date of Birth: [birth year] Referring Provider (PT): Tammie Decamp, MD   Encounter Date: 06/11/2019  PT End of Session - 06/11/19 1102    Visit Number  5    Number of Visits  12    Date for PT Re-Evaluation  07/10/19    PT Start Time  1102    PT Stop Time  1146    PT Time Calculation (min)  44 min    Activity Tolerance  Patient tolerated treatment well    Behavior During Therapy  [facility] for tasks assessed/performed       Past Medical History:  Diagnosis Date  . Back pain     Past Surgical History:  Procedure Laterality Date  . LUMBAR DISC SURGERY     x2, L4-L5  . PATELLAR TENDON REPAIR Bilateral   . TOTAL ABDOMINAL HYSTERECTOMY  10/2018    There were no vitals filed for this visit.  Subjective Assessment - 06/11/19 1103    Subjective  Patient worked out this morning. Tingling was in and out over the weekend and then bothered last night. Tingling now in digit 2 and tip of digit 3. Only tightness with left cervical rotation. Gives discomfor 0.5/10.    Pertinent History  h/o ovarian cancer    Patient Stated Goals  return to working out, be able to feel index and middle fingers again    Currently in Pain?  No/denies                       [facility] Adult PT Treatment/Exercise - 06/11/19 0001      Manual Therapy   Manual Therapy  Soft tissue mobilization    Manual therapy comments  skilled palpation and monitoring of soft tissue during DN    Soft tissue mobilization  to muscles surrounding left shoulder girdle, left cervical and thoracic paraspinals       Trigger Point Dry Needling - 06/11/19 0001    Consent Given?  Yes    Education Handout Provided  Previously provided    Muscles Treated Head and Neck  Upper  trapezius;Levator scapulae;Cervical multifidi    Muscles Treated Upper Quadrant  Rhomboids;Infraspinatus;Subscapularis;Latissimus dorsi;Teres major;Triceps    Other Dry Needling  T1-3 left mulitfidi    Upper Trapezius Response  Twitch reponse elicited;Palpable increased muscle length    Levator Scapulae Response  Twitch response elicited;Palpable increased muscle length    Cervical multifidi Response  Twitch reponse elicited;Palpable increased muscle length    Rhomboids Response  Twitch response elicited;Palpable increased muscle length    Infraspinatus Response  Twitch response elicited;Palpable increased muscle length    Latissimus dorsi Response  Twitch response elicited;Palpable increased muscle length    Teres major Response  Twitch response elicited;Palpable increased muscle length    Triceps Response  Twitch response elicited;Palpable increased muscle length                PT Long Term Goals - 05/29/19 1252      PT LONG TERM GOAL #1   Title  independent with HEP    Status  New    Target Date  07/10/19      PT LONG TERM GOAL #2   Title  improve Lt cervical rotation to at least 65 degrees without pain for improved function  Status  New    Target Date  07/10/19      PT LONG TERM GOAL #3   Title  demonstrate Lt shoulder flexion 5/5 for improved strength and function    Status  New    Target Date  07/10/19      PT LONG TERM GOAL #4   Title  report pain < 4/10 with activity for improved function    Status  New    Target Date  07/10/19            Plan - 06/11/19 1148    Clinical Impression Statement  Patient responded very well to DN in cervical and upper thoracic multifidi as well as in left triceps. Decreased tissue tension noted throughout left upper quadrant following treatment today.    PT Treatment/Interventions  ADLs/Self Care Home Management;Cryotherapy;Ultrasound;Traction;Moist Heat;Iontophoresis 4mg /ml Dexamethasone;Functional mobility  training;Therapeutic activities;Therapeutic exercise;Patient/family education;Manual techniques;Passive range of motion;Taping;Dry needling    PT Next Visit Plan  continue manual/DN (no estim-recent cancer), continue posture strengthening exercises    PT Home Exercise Plan  Access Code: [email or web address]       Patient will benefit from skilled therapeutic intervention in order to improve the following deficits and impairments:  Decreased range of motion, Increased fascial restricitons, Increased muscle spasms, Pain, Postural dysfunction, Decreased strength  Visit Diagnosis: Radiculopathy, cervical region  Abnormal posture     Problem List Patient Active Problem List   Diagnosis Date Noted  . Radiculitis of left cervical region 05/22/2019  . Bacterial vaginosis 05/30/2018    06/01/2018 PT 06/11/2019, 1:22 PM  [facility] [address] [place], [place], [place] Phone: [phone]   Fax:  [phone]  Name: Tammie Jones MRN: Tammie Jones Date of Birth: April 03, 1976

## 2019-06-14 ENCOUNTER — Encounter: Payer: BC Managed Care – PPO | Admitting: Physical Therapy

## 2019-06-15 ENCOUNTER — Ambulatory Visit (INDEPENDENT_AMBULATORY_CARE_PROVIDER_SITE_OTHER): Payer: BC Managed Care – PPO | Admitting: Physical Therapy

## 2019-06-15 ENCOUNTER — Other Ambulatory Visit: Payer: Self-pay

## 2019-06-15 DIAGNOSIS — M5412 Radiculopathy, cervical region: Secondary | ICD-10-CM | POA: Diagnosis not present

## 2019-06-15 DIAGNOSIS — R293 Abnormal posture: Secondary | ICD-10-CM

## 2019-06-15 NOTE — Patient Instructions (Signed)
Access Code: [email or web address]  URL: [email or web address]  Date: 06/15/2019  Prepared by: Kerin Perna   Exercises  added Standing Shoulder Flexion with Resistance - 10 reps - 1-2 sets - 1x daily - 5x weekly  Standing Single Arm Shoulder Abduction with Resistance - 10 reps - 1-2 sets - 1x daily - 5x weekly  Split Stance Shoulder Row with Resistance - 10 reps - 1-2 sets - 1x daily - 5x weekly

## 2019-06-15 NOTE — Therapy (Signed)
[place] [place], [place], [address] Phone: [phone]   Fax:  [phone]  Physical Therapy Treatment  Patient Details  Name: Tammie Jones MRN: [id number] Date of Birth: 28-Feb-1976 Referring Provider (PT): Silverio Decamp, MD   Encounter Date: 06/15/2019  PT End of Session - 06/15/19 0931    Visit Number  6    Number of Visits  12    Date for PT Re-Evaluation  07/10/19    PT Start Time  0932    PT Stop Time  1012    PT Time Calculation (min)  40 min    Activity Tolerance  Patient tolerated treatment well    Behavior During Therapy  [facility] for tasks assessed/performed       Past Medical History:  Diagnosis Date  . Back pain     Past Surgical History:  Procedure Laterality Date  . LUMBAR DISC SURGERY     x2, L4-L5  . PATELLAR TENDON REPAIR Bilateral   . TOTAL ABDOMINAL HYSTERECTOMY  10/2018    There were no vitals filed for this visit.  Subjective Assessment - 06/15/19 0932    Subjective  Pt worked out this morning. She didn't have any issues pain wise but she still has numbness in her finger tips and tightness in her shoulder. She had some pain and muscle twitching after DN; subsided after a few days.    Currently in Pain?  No/denies - rates tingling in hand as 5/10.         [facility] PT Assessment - 06/15/19 0001      Assessment   Medical Diagnosis  M54.12 (ICD-10-CM) - Radiculitis of left cervical region    Referring Provider (PT)  Silverio Decamp, MD    Onset Date/Surgical Date  05/17/19    Hand Dominance  Right    Next MD Visit  07/03/2019    Prior Therapy  for low back (2 back surgeries)      AROM   Cervical - Right Rotation  70    Cervical - Left Rotation  69      Strength   Left Shoulder Flexion  4+/5   Simultaneous filing. User may not have seen previous data.   Left Shoulder ABduction  --   5-/5 Simultaneous filing. User may not have seen previous data.       [facility] Adult PT Treatment/Exercise - 06/15/19 0001      Self-Care   Other Self-Care Comments   Pt encouraged to hold off on UE exercises outside therapy, except ones given in session, to avoid continued irritation of nerves in UE; pt verbalized understanding      Neck Exercises: Machines for Strengthening   UBE (Upper Arm Bike)  L1 x 1 min forward/backward      Shoulder Exercises: Standing   External Rotation Limitations  trial, one rep with green band - increased tingling in finger; stopped    Flexion  Strengthening;Left;12 reps    Theraband Level (Shoulder Flexion)  Level 2 (Red)    ABduction  Strengthening;Left;10 reps;15 reps    Theraband Level (Shoulder ABduction)  Level 3 (Green)    Row  Strengthening;Both;15 reps    Theraband Level (Shoulder Row)  Level 3 (Green)      Modalities   Modalities  --   declined; will use ice at home.      Manual Therapy   Manual Therapy  Soft tissue mobilization;Taping    Soft tissue mobilization  IASTM  to Lt cervical paraspinals, scalenes, upper trap, levator, infraspinatus, wrist extensors - decrease fascial restrictions     Kinesiotex  IT sales professional  I strip of sensitive skin applied along superficial back arm line with 15% stretch, and perpendicular strips with 50% stretch at levator, infraspinatus, mid tricep, and lateral epicondyle      Neck Exercises: Stretches   Upper Trapezius Stretch  Right;Left;2 reps;20 seconds    Other Neck Stretches  3 position doorway stretch x 1 rep of 20 sec of each; bilat bicep stretch holding door frame x 1 rep of 20 sec;  Lt/Rt tricep stretch x 2 reps of 20 sec              PT Education - 06/15/19 1024    Education Details  HEP, issued red and green band.    Person(s) Educated  Patient    Methods  Explanation;Demonstration;Verbal cues   pt declined handout   Comprehension  Verbalized understanding;Returned demonstration          PT Long Term Goals - 06/15/19  1030      PT LONG TERM GOAL #1   Title  independent with HEP    Status  On-going      PT LONG TERM GOAL #2   Title  improve Lt cervical rotation to at least 65 degrees without pain for improved function    Status  Achieved      PT LONG TERM GOAL #3   Title  demonstrate Lt shoulder flexion 5/5 for improved strength and function    Status  On-going      PT LONG TERM GOAL #4   Title  report pain < 4/10 with activity for improved function    Status  On-going            Plan - 06/15/19 1024    Clinical Impression Statement  Continued positive response to DN last session with reduced nerve symptoms in UE for 2 days.  Pt has reported feeling so well, she forgets there is an issue then may over do it with exercises.  Encouraged pt to hold off on upper body exercises (except HEP) to assist in irritation of symptoms. Pt reported reduction of radicular symptoms from 5/10 to 3/10 after IASTM.  Pt demonstrated improved shoulder strength and cervical rotation; has met LTG#2. Making good gains towards remaining goals.    Rehab Potential  Good    PT Frequency  2x / week    PT Duration  6 weeks    PT Treatment/Interventions  ADLs/Self Care Home Management;Cryotherapy;Ultrasound;Traction;Moist Heat;Iontophoresis 37m/ml Dexamethasone;Functional mobility training;Therapeutic activities;Therapeutic exercise;Patient/family education;Manual techniques;Passive range of motion;Taping;Dry needling    PT Next Visit Plan  continue manual/DN (no estim-recent cancer), continue posture strengthening exercises.  [facility].    PT Home Exercise Plan  Access Code: [email or web address]    Consulted and Agree with Plan of Care  Patient       Patient will benefit from skilled therapeutic intervention in order to improve the following deficits and impairments:  Decreased range of motion, Increased fascial restricitons, Increased muscle spasms, Pain, Postural dysfunction, Decreased strength  Visit  Diagnosis: Radiculopathy, cervical region  Abnormal posture     Problem List Patient Active Problem List   Diagnosis Date Noted  . Radiculitis of left cervical region 05/22/2019  . Bacterial vaginosis 05/30/2018   JKerin Perna PTA 06/15/19 10:59 AM  [place] [phone]  [place], [place], [address] Phone: [phone]   Fax:  [phone]  Name: Tammie Jones MRN: [id number] Date of Birth: 1976/08/01

## 2019-06-18 ENCOUNTER — Ambulatory Visit (INDEPENDENT_AMBULATORY_CARE_PROVIDER_SITE_OTHER): Payer: BC Managed Care – PPO | Admitting: Rehabilitative and Restorative Service Providers"

## 2019-06-18 ENCOUNTER — Encounter: Payer: BC Managed Care – PPO | Admitting: Physical Therapy

## 2019-06-18 ENCOUNTER — Other Ambulatory Visit: Payer: Self-pay

## 2019-06-18 DIAGNOSIS — R293 Abnormal posture: Secondary | ICD-10-CM

## 2019-06-18 DIAGNOSIS — M5412 Radiculopathy, cervical region: Secondary | ICD-10-CM | POA: Diagnosis not present

## 2019-06-18 NOTE — Patient Instructions (Signed)
Access Code: [email or web address]  URL: [email or web address]  Date: 06/18/2019  Prepared by: Rudell Cobb   Exercises Seated Upper Trapezius Stretch - 3 reps - 1 sets - 30 sec hold - 2x daily - 7x weekly Seated Levator Scapulae Stretch - 1 reps - 1 sets - 30 sec hold - 2x daily - 7x weekly First Rib Mobilization with Strap - 10 reps - 3 sets - 1x daily - 7x weekly Doorway Pec Stretch at 90 Degrees Abduction - 5 reps - 1 sets - 2x daily - 7x weekly Doorway Pec Stretch at 120 Degrees Abduction - 5 reps - 1 sets - 15-20 hold - 2x daily - 7x weekly Standing Scapular Retraction - 15 reps - 1-2 sets - 1x daily - 5x weekly Standing Shoulder Flexion with Resistance - 10 reps - 1-2 sets - 1x daily - 5x weekly Standing Single Arm Shoulder Abduction with Resistance - 10 reps - 1-2 sets - 1x daily - 5x weekly Split Stance Shoulder Row with Resistance - 10 reps - 1-2 sets - 1x daily - 5x weekly

## 2019-06-18 NOTE — Therapy (Signed)
[facility] [place] [address] [place], [place], [address] Phone: [phone]   Fax:  [phone]  Physical Therapy Treatment  Patient Details  Name: Tammie Jones MRN: [id number] Date of Birth: July 31, 1976 Referring Provider (PT): Monica Becton, MD   Encounter Date: 06/18/2019  PT End of Session - 06/18/19 0848    Visit Number  7    Number of Visits  12    Date for PT Re-Evaluation  07/10/19    PT Start Time  0845    PT Stop Time  0930    PT Time Calculation (min)  45 min    Activity Tolerance  Patient tolerated treatment well    Behavior During Therapy  [facility] for tasks assessed/performed       Past Medical History:  Diagnosis Date  . Back pain     Past Surgical History:  Procedure Laterality Date  . LUMBAR DISC SURGERY     x2, L4-L5  . PATELLAR TENDON REPAIR Bilateral   . TOTAL ABDOMINAL HYSTERECTOMY  10/2018    There were no vitals filed for this visit.  Subjective Assessment - 06/18/19 0846    Subjective  The patient got pins and needles sensation x hours yesterday in first 2 digits.  She feels that L shoulder pain has resolved, she is continuing with tingling in fingers.  The tape helped last session.  Sheis no longer having painful points in posterior aspect of L arm.    Pertinent History  h/o ovarian cancer    Patient Stated Goals  return to working out, be able to feel index and middle fingers again    Currently in Pain?  No/denies                       [facility] Adult PT Treatment/Exercise - 06/18/19 0854      Exercises   Exercises  Neck;Shoulder      Neck Exercises: Machines for Strengthening   UBE (Upper Arm Bike)  L2 x 1.5 minutes forward/backwards      Neck Exercises: Theraband   Rows  10 reps;Blue    Rows Limitations  Standing with band above level of head for pull down initially with elbow extended, then with elbow flexed.    Other Theraband Exercises  Facing away from  doorframe using blue band and pulling down anteriorly with L shoulder      Neck Exercises: Seated   Other Seated Exercise  First rib depression uisng belt to stabilize left shoulder and tilt away from belt.      Neck Exercises: Prone   Rows  10 reps;Weights    Rows Weights (lbs)  2 lbs    Upper Extremity Flexion with Stabilization  Flexion;10 reps    UE Flexion with Stabilization Limitations  2 lb doing overhead "superman" exercise with resistance cuing patinet to reduce shoulder elevation    Other Prone Exercise  Shoulder press ups x 10 reps with cues for upper thoracic stretch.    Other Prone Exercise  --      Manual Therapy   Manual Therapy  Joint mobilization;Soft tissue mobilization;Myofascial release;Manual Traction;Neural Stretch    Manual therapy comments  Goal of pain reduction, reduce muscle tightness    Joint Mobilization  Prone:  P>A upper to mid thoracic grade II-III    Soft tissue mobilization  STM L upper trap, L levator, L rhomboids and L paraspinal (c-spine) musculature in R sidelying    Myofascial Release  suboccipital release    Manual Traction  gentle manual traction supine with passive overpressure/ stretch in all planes    Neural Stretch  supine neural gliding with wrist flexion/extension and head rotation to right side for greater stretch.             PT Education - 06/18/19 [phone]    Education Details  Added L first rib mobilization stretch for positioning to current HEP    Person(s) Educated  Patient    Methods  Explanation;Demonstration;Handout    Comprehension  Returned demonstration;Verbalized understanding          PT Long Term Goals - 06/15/19 1030      PT LONG TERM GOAL #1   Title  independent with HEP    Status  On-going      PT LONG TERM GOAL #2   Title  improve Lt cervical rotation to at least 65 degrees without pain for improved function    Status  Achieved      PT LONG TERM GOAL #3   Title  demonstrate Lt shoulder flexion 5/5 for  improved strength and function    Status  On-going      PT LONG TERM GOAL #4   Title  report pain < 4/10 with activity for improved function    Status  On-going            Plan - 06/18/19 0944    Clinical Impression Statement  The patient is having decreased proximal pain, but does note continued numbness in distal tips of 2 fingers.  She is limiting UE exercise except for prescribed PT routine.  We worked on strengthening with band today with tolerance to activities.  She continues to make progress to LTGs.    PT Treatment/Interventions  ADLs/Self Care Home Management;Cryotherapy;Ultrasound;Traction;Moist Heat;Iontophoresis 4mg /ml Dexamethasone;Functional mobility training;Therapeutic activities;Therapeutic exercise;Patient/family education;Manual techniques;Passive range of motion;Taping;Dry needling    PT Next Visit Plan  continue manual/DN (no estim-recent cancer), continue posture strengthening exercises.  Assess response to [facility] tape application.    PT Home Exercise Plan  Access Code: [email or web address]    Consulted and Agree with Plan of Care  Patient       Patient will benefit from skilled therapeutic intervention in order to improve the following deficits and impairments:  Decreased range of motion, Increased fascial restricitons, Increased muscle spasms, Pain, Postural dysfunction, Decreased strength  Visit Diagnosis: Abnormal posture  Radiculopathy, cervical region     Problem List Patient Active Problem List   Diagnosis Date Noted  . Radiculitis of left cervical region 05/22/2019  . Bacterial vaginosis 05/30/2018    Kade Demicco , PT 06/18/2019, 9:46 AM  [facility] [place], [place], [address] Phone: [phone]   Fax:  [phone]  Name: Tammie Jones MRN: [id number] Date of Birth: [birth year]

## 2019-06-21 ENCOUNTER — Other Ambulatory Visit: Payer: Self-pay

## 2019-06-21 ENCOUNTER — Encounter: Payer: Self-pay | Admitting: Physical Therapy

## 2019-06-21 ENCOUNTER — Ambulatory Visit (INDEPENDENT_AMBULATORY_CARE_PROVIDER_SITE_OTHER): Payer: BC Managed Care – PPO | Admitting: Physical Therapy

## 2019-06-21 DIAGNOSIS — R293 Abnormal posture: Secondary | ICD-10-CM | POA: Diagnosis not present

## 2019-06-21 DIAGNOSIS — M5412 Radiculopathy, cervical region: Secondary | ICD-10-CM | POA: Diagnosis not present

## 2019-06-21 NOTE — Patient Instructions (Signed)
Access Code: [email or web address]  URL: [email or web address]  Date: 06/21/2019  Prepared by: Almyra Free Chadrick Sprinkle   Exercises Seated Upper Trapezius Stretch - 3 reps - 1 sets - 30 sec hold - 2x daily - 7x weekly Seated Levator Scapulae Stretch - 1 reps - 1 sets - 30 sec hold - 2x daily - 7x weekly First Rib Mobilization with Strap - 10 reps - 3 sets - 1x daily - 7x weekly Doorway Pec Stretch at 90 Degrees Abduction - 5 reps - 1 sets - 2x daily - 7x weekly Doorway Pec Stretch at 120 Degrees Abduction - 5 reps - 1 sets - 15-20 hold - 2x daily - 7x weekly Standing Scapular Retraction - 15 reps - 1-2 sets - 1x daily - 5x weekly Standing Shoulder Flexion with Resistance - 10 reps - 1-2 sets - 1x daily - 5x weekly Standing Single Arm Shoulder Abduction with Resistance - 10 reps - 1-2 sets - 1x daily - 5x weekly Split Stance Shoulder Row with Resistance - 10 reps - 1-2 sets - 1x daily - 5x weekly Sidelying Shoulder External Rotation with Dumbbell - 10 reps - 3 sets - 1x daily - 7x weekly Prone Single Arm Shoulder Horizontal Abduction with Dumbbell - Palm Down - 10 reps - 3 sets - 1x daily - 7x weekly Prone Single Arm Shoulder Y with Dumbbell - 10 reps - 3 sets - 1x daily - 7x weekly

## 2019-06-21 NOTE — Therapy (Signed)
[place], [place], [address] Phone: [phone]   Fax:  [phone]  Physical Therapy Treatment  Patient Details  Name: Tammie Jones MRN: [id number] Date of Birth: 11-14-1975 Referring Provider (PT): Silverio Decamp, MD   Encounter Date: 06/21/2019  PT End of Session - 06/21/19 1149    Visit Number  8    Number of Visits  12    Date for PT Re-Evaluation  07/10/19    PT Start Time  7425    PT Stop Time  9563    PT Time Calculation (min)  46 min    Activity Tolerance  Patient tolerated treatment well;Other (comment)   became lightheaded after DN   Behavior During Therapy  WFL for tasks assessed/performed       Past Medical History:  Diagnosis Date  . Back pain     Past Surgical History:  Procedure Laterality Date  . LUMBAR DISC SURGERY     x2, L4-L5  . PATELLAR TENDON REPAIR Bilateral   . TOTAL ABDOMINAL HYSTERECTOMY  10/2018    There were no vitals filed for this visit.  Subjective Assessment - 06/21/19 1149    Subjective  no pain but still having numbness in tip of digit 3 and entire digit 2. Tape felt good.    Patient Stated Goals  return to working out, be able to feel index and middle fingers again    Currently in Pain?  No/denies                       [facility] Adult PT Treatment/Exercise - 06/21/19 0001      Neck Exercises: Machines for Strengthening   UBE (Upper Arm Bike)  L2 x 1.5 minutes forward/backwards    Lat Pull  2 plates x 10; 3 plates x 10 with eccentric return      Neck Exercises: Theraband   Rows  10 reps;Blue      Shoulder Exercises: Supine   Protraction  20 reps;Weights    Protraction Weight (lbs)  5      Shoulder Exercises: Prone   Extension  Left;Weights;20 reps    Extension Weight (lbs)  1 and 2# 10 ea    Horizontal ABduction 1  Left;10 reps    Horizontal ABduction 1 Weight (lbs)  1    Horizontal ABduction 2  Left;10 reps;Weights     Horizontal ABduction 2 Weight (lbs)  1      Shoulder Exercises: Sidelying   External Rotation  Left;20 reps;Weights    External Rotation Weight (lbs)  2    External Rotation Limitations  fatigues easily      Shoulder Exercises: Standing   Protraction Limitations  attempted with band but poor stabilization    External Rotation Limitations  red band x 5 winging in scapula      Manual Therapy   Manual Therapy  Soft tissue mobilization    Manual therapy comments  skilled palpation and monitoring of soft tissue during DN     Soft tissue mobilization  to left lats/infraspinatus       Trigger Point Dry Needling - 06/21/19 0001    Consent Given?  Yes    Education Handout Provided  Previously provided    Muscles Treated Head and Neck  Upper trapezius    Muscles Treated Upper Quadrant  Infraspinatus;Latissimus dorsi    Levator Scapulae Response  Twitch response elicited;Palpable increased muscle length    Infraspinatus Response  Twitch response elicited;Palpable increased muscle length           PT Education - 06/21/19 1243    Education Details  HEP progressed    Person(s) Educated  Patient    Methods  Explanation;Demonstration;Handout    Comprehension  Verbalized understanding;Returned demonstration          PT Long Term Goals - 06/15/19 1030      PT LONG TERM GOAL #1   Title  independent with HEP    Status  On-going      PT LONG TERM GOAL #2   Title  improve Lt cervical rotation to at least 65 degrees without pain for improved function    Status  Achieved      PT LONG TERM GOAL #3   Title  demonstrate Lt shoulder flexion 5/5 for improved strength and function    Status  On-going      PT LONG TERM GOAL #4   Title  report pain < 4/10 with activity for improved function    Status  On-going            Plan - 06/21/19 1243    Clinical Impression Statement  Pt demonstrating significant winging with ER today so worked on protraction, horizontal ABD and  scaption in prone where pt was very weak. Continued c/o tingling in digits 2 and 3 which may be originating from infraspinatus tightness.  Patient had excellent response to DN in left lats and IS today. She did feel lightheaded afterward but this resolved after sitting for a couple of minutes. LTGs ongoing.    Comorbidities  lumbar disc surgery x 2; abdominal hysterectomy (just completed tx for ovarian cancer)    PT Treatment/Interventions  ADLs/Self Care Home Management;Cryotherapy;Ultrasound;Traction;Moist Heat;Iontophoresis 4mg /ml Dexamethasone;Functional mobility training;Therapeutic activities;Therapeutic exercise;Patient/family education;Manual techniques;Passive range of motion;Taping;Dry needling    PT Next Visit Plan  Focus on RC strengthening, mid/low traps and serratus. Try quadriped position for serratus. Assess DN response; (no estim-recent cancer),.    PT Home Exercise Plan  Access Code: [email or web address]    Consulted and Agree with Plan of Care  Patient       Patient will benefit from skilled therapeutic intervention in order to improve the following deficits and impairments:  Decreased range of motion, Increased fascial restricitons, Increased muscle spasms, Pain, Postural dysfunction, Decreased strength  Visit Diagnosis: Abnormal posture  Radiculopathy, cervical region     Problem List Patient Active Problem List   Diagnosis Date Noted  . Radiculitis of left cervical region 05/22/2019  . Bacterial vaginosis 05/30/2018    06/01/2018 PT 06/21/2019, 1:00 PM  [facility] [address] [place], [place], [place] Phone: [phone]   Fax:  [phone]  Name: Tammie Jones MRN: [name] Date of Birth: 04-18-1976

## 2019-06-25 ENCOUNTER — Ambulatory Visit (INDEPENDENT_AMBULATORY_CARE_PROVIDER_SITE_OTHER): Payer: BC Managed Care – PPO | Admitting: Physical Therapy

## 2019-06-25 ENCOUNTER — Encounter: Payer: BC Managed Care – PPO | Admitting: Physical Therapy

## 2019-06-25 ENCOUNTER — Other Ambulatory Visit: Payer: Self-pay

## 2019-06-25 DIAGNOSIS — M5412 Radiculopathy, cervical region: Secondary | ICD-10-CM | POA: Diagnosis not present

## 2019-06-25 DIAGNOSIS — R293 Abnormal posture: Secondary | ICD-10-CM

## 2019-06-25 NOTE — Therapy (Addendum)
[place]  [place], [place], [address] Phone: [phone]   Fax:  [phone]  Physical Therapy Treatment And Discharge Summary   Patient Details  Name: Tammie Jones MRN: [id number] Date of Birth: November 08, 1975 Referring Provider (PT): Silverio Decamp, MD   Encounter Date: 06/25/2019  PT End of Session - 06/25/19 0856    Visit Number  9    Number of Visits  12    Date for PT Re-Evaluation  07/10/19    PT Start Time  2595   pt arrived late   PT Stop Time  0931    PT Time Calculation (min)  38 min    Activity Tolerance  Patient tolerated treatment well;Other (comment)   became lightheaded after DN   Behavior During Therapy  WFL for tasks assessed/performed       Past Medical History:  Diagnosis Date  . Back pain     Past Surgical History:  Procedure Laterality Date  . LUMBAR DISC SURGERY     x2, L4-L5  . PATELLAR TENDON REPAIR Bilateral   . TOTAL ABDOMINAL HYSTERECTOMY  10/2018    There were no vitals filed for this visit.  Subjective Assessment - 06/25/19 0857    Subjective  Hasn't had pain in 1.5 wks, however numbness remains constant in first and middle finger of Lt hand. She wonders if chemo plays a role in the numbness. She has numbness in feet that began during chemo.    Patient Stated Goals  return to working out, be able to feel index and middle fingers again    Currently in Pain?  No/denies    Pain Score  0-No pain         [facility] PT Assessment - 06/25/19 0001      Assessment   Medical Diagnosis  M54.12 (ICD-10-CM) - Radiculitis of left cervical region    Referring Provider (PT)  Silverio Decamp, MD    Onset Date/Surgical Date  05/17/19    Hand Dominance  Right    Next MD Visit  07/03/2019    Prior Therapy  for low back (2 back surgeries)        [facility] Adult PT Treatment/Exercise - 06/25/19 0001      Shoulder Exercises: Supine   Protraction  Strengthening;Left;10  reps    Protraction Weight (lbs)  5   slow, eccentric lowering   Diagonals  Strengthening;Left;10 reps;Theraband    Theraband Level (Shoulder Diagonals)  Level 3 (Green)      Shoulder Exercises: Prone   Extension  Strengthening;Left;10 reps    Extension Weight (lbs)  2    Horizontal ABduction 1  Left;10 reps   T's   Horizontal ABduction 1 Weight (lbs)  1, 2   5 reps with 1#, 10 with 2# and 3 sec hold   Horizontal ABduction 2  Left;10 reps   Y's   Horizontal ABduction 2 Weight (lbs)  1      Shoulder Exercises: Sidelying   External Rotation  Strengthening;Left;12 reps    External Rotation Weight (lbs)  2    External Rotation Limitations  fatigues easily, reports fatigue in bicep      Shoulder Exercises: Stretch   Other Shoulder Stretches  3 position doorway stretch x 20 sec x 2 reps each position; bilat bicep stretch x 20 sec x 1 reps      Manual Therapy   Soft tissue mobilization  IASTM to Lt cervical paraspinals, upper trap, Lt ant and  posterior shoulder to decrease fascial restrictions.      Neural Stretch  standing LUE neural stretch with palm on wall and turning body away x 3 reps (limited tolerance), Lt median/radial nerve glides x 5 reps in stranding      Neck Exercises: Stretches   Upper Trapezius Stretch  Right;2 reps;10 seconds    Other Neck Stretches  self- MFR with lateral flexion and rotation/lateral flexion to stretch SCM and scalenes on Lt.                    PT Long Term Goals - 06/25/19 1222      PT LONG TERM GOAL #1   Title  independent with HEP    Status  On-going      PT LONG TERM GOAL #2   Title  improve Lt cervical rotation to at least 65 degrees without pain for improved function    Status  Achieved      PT LONG TERM GOAL #3   Title  demonstrate Lt shoulder flexion 5/5 for improved strength and function    Status  On-going      PT LONG TERM GOAL #4   Title  report pain < 4/10 with activity for improved function    Status  Achieved             Plan - 06/25/19 1059    Clinical Impression Statement  Pt now pain free in neck and LUE, however radicular symptoms into Lt first and middle finger remain constant regardless of positioning, posture, or exercises. She has met LTG #4. After discussion with supervising PT, Madelyn Flavors, Pt agreeable to hold therapy until MD appt.     Comorbidities  lumbar disc surgery x 2; abdominal hysterectomy (just completed tx for ovarian cancer)    PT Frequency  2x / week    PT Duration  6 weeks    PT Treatment/Interventions  ADLs/Self Care Home Management;Cryotherapy;Ultrasound;Traction;Moist Heat;Iontophoresis 70m/ml Dexamethasone;Functional mobility training;Therapeutic activities;Therapeutic exercise;Patient/family education;Manual techniques;Passive range of motion;Taping;Dry needling    PT Next Visit Plan  spoke with supervising PT; will hold until MD appt; pt to continue HEP. Will await further direction from MD.    PT Home Exercise Plan  Access Code: [email or web address]    Consulted and Agree with Plan of Care  Patient       Patient will benefit from skilled therapeutic intervention in order to improve the following deficits and impairments:  Decreased range of motion, Increased fascial restricitons, Increased muscle spasms, Pain, Postural dysfunction, Decreased strength  Visit Diagnosis: Abnormal posture  Radiculopathy, cervical region     Problem List Patient Active Problem List   Diagnosis Date Noted  . Radiculitis of left cervical region 05/22/2019  . Bacterial vaginosis 05/30/2018   JKerin Perna PTA 06/25/19 12:24 PM  [place] [address]: [phone]  Fax:  [phone] Name: Tammie ToporMRN: [id number] of Birth: 3Jul 06, 1977 PHYSICAL THERAPY DISCHARGE SUMMARY  Visits from Start of Care: 9  Current functional level related to goals / functional  outcomes: See Above   Remaining deficits: See Above   Education / Equipment: HEP  Plan: Patient agrees to discharge.  Patient goals were partially met. Patient is being discharged due to a change in medical status.  ?????Patient having epidural.    JMadelyn Flavors PT 07/12/19 5:39 PM  [facility] [address]  [place], [place] [address]  [phone] (office) [phone] (fax)

## 2019-06-28 ENCOUNTER — Encounter: Payer: BC Managed Care – PPO | Admitting: Physical Therapy

## 2019-06-29 ENCOUNTER — Encounter: Payer: Self-pay | Admitting: Sports Medicine

## 2019-06-29 ENCOUNTER — Other Ambulatory Visit: Payer: Self-pay

## 2019-06-29 ENCOUNTER — Ambulatory Visit (INDEPENDENT_AMBULATORY_CARE_PROVIDER_SITE_OTHER): Payer: BC Managed Care – PPO | Admitting: Sports Medicine

## 2019-06-29 DIAGNOSIS — M5412 Radiculopathy, cervical region: Secondary | ICD-10-CM | POA: Diagnosis not present

## 2019-06-29 NOTE — Assessment & Plan Note (Signed)
Left C7 distribution radiculitis, confirmed with MRI. She is much better with physical therapy, tramadol, amitriptyline, gabapentin. Still with persistent paresthesias on the left index and middle fingers, proceeding with a left C6-C7 interlaminar epidural. Return to see me 1 month after the injection to evaluate response.

## 2019-06-29 NOTE — Progress Notes (Signed)
Subjective:    CC: Follow-up  HPI: Lance Bosch returns, she is a pleasant 43 year old female with left C7 distribution radiculitis, improved considerably, pain, range of motion much better, strength much better after PT, still has trace paresthesias on the left hand in a C7 distribution and is agreeable to go to the neck step.  I reviewed the past medical history, family history, social history, surgical history, and allergies today and no changes were needed.  Please see the problem list section below in epic for further details.  Past Medical History: Past Medical History:  Diagnosis Date  . Back pain    Past Surgical History: Past Surgical History:  Procedure Laterality Date  . LUMBAR DISC SURGERY     x2, L4-L5  . PATELLAR TENDON REPAIR Bilateral   . TOTAL ABDOMINAL HYSTERECTOMY  10/2018   Social History: Social History   Socioeconomic History  . Marital status: Single    Spouse name: Not on file  . Number of children: Not on file  . Years of education: Not on file  . Highest education level: Not on file  Occupational History  . Not on file  Social Needs  . Financial resource strain: Not on file  . Food insecurity    Worry: Not on file    Inability: Not on file  . Transportation needs    Medical: Not on file    Non-medical: Not on file  Tobacco Use  . Smoking status: Never Smoker  . Smokeless tobacco: Never Used  Substance and Sexual Activity  . Alcohol use: Yes    Alcohol/week: 2.0 - 3.0 standard drinks    Types: 2 - 3 Standard drinks or equivalent per week  . Drug use: Never  . Sexual activity: Yes    Birth control/protection: Other-see comments    Comment: same sex partner  Lifestyle  . Physical activity    Days per week: Not on file    Minutes per session: Not on file  . Stress: Not on file  Relationships  . Social Musician on phone: Not on file    Gets together: Not on file    Attends religious service: Not on file    Active member of club  or organization: Not on file    Attends meetings of clubs or organizations: Not on file    Relationship status: Not on file  Other Topics Concern  . Not on file  Social History Narrative  . Not on file   Family History: No family history on file. Allergies: No Known Allergies Medications: See med rec.  Review of Systems: No fevers, chills, night sweats, weight loss, chest pain, or shortness of breath.   Objective:    General: Well Developed, well nourished, and in no acute distress.  Neuro: Alert and oriented x3, extra-ocular muscles intact, sensation grossly intact.  HEENT: Normocephalic, atraumatic, pupils equal round reactive to light, neck supple, no masses, no lymphadenopathy, thyroid nonpalpable.  Skin: Warm and dry, no rashes. Cardiac: Regular rate and rhythm, no murmurs rubs or gallops, no lower extremity edema.  Respiratory: Clear to auscultation bilaterally. Not using accessory muscles, speaking in full sentences.  Impression and Recommendations:    Radiculitis of left cervical region Left C7 distribution radiculitis, confirmed with MRI. She is much better with physical therapy, tramadol, amitriptyline, gabapentin. Still with persistent paresthesias on the left index and middle fingers, proceeding with a left C6-C7 interlaminar epidural. Return to see me 1 month after the injection to evaluate  response.   ___________________________________________ Gwen Her. Dianah Field, M.D., ABFM., CAQSM. Primary Care and Sports Medicine [facility]  Adjunct Professor of [employer]

## 2019-07-02 ENCOUNTER — Encounter: Payer: BC Managed Care – PPO | Admitting: Physical Therapy

## 2019-07-03 ENCOUNTER — Ambulatory Visit: Payer: BC Managed Care – PPO | Admitting: Sports Medicine

## 2019-07-04 ENCOUNTER — Encounter: Payer: BC Managed Care – PPO | Admitting: Physical Therapy

## 2019-07-10 ENCOUNTER — Ambulatory Visit
Admission: RE | Admit: 2019-07-10 | Discharge: 2019-07-10 | Disposition: A | Discharge status: Home / Self Care | Payer: BC Managed Care – PPO | Source: Ambulatory Visit | Attending: Sports Medicine | Admitting: Sports Medicine

## 2019-07-10 ENCOUNTER — Other Ambulatory Visit: Payer: Self-pay

## 2019-07-10 MED ORDER — TRIAMCINOLONE ACETONIDE 40 MG/ML IJ SUSP (RADIOLOGY)
60.0000 mg | Freq: Once | INTRAMUSCULAR | Status: AC
  Administered 2019-07-10: 60 mg via EPIDURAL

## 2019-07-10 MED ORDER — IOPAMIDOL (ISOVUE-M 300) INJECTION 61%
1.0000 mL | Freq: Once | INTRAMUSCULAR | Status: AC | PRN
  Administered 2019-07-10: 1 mL via EPIDURAL

## 2019-07-10 NOTE — Discharge Instructions (Signed)

## 2019-07-31 ENCOUNTER — Other Ambulatory Visit: Payer: Self-pay

## 2019-07-31 ENCOUNTER — Encounter: Payer: Self-pay | Admitting: Sports Medicine

## 2019-07-31 ENCOUNTER — Ambulatory Visit (INDEPENDENT_AMBULATORY_CARE_PROVIDER_SITE_OTHER): Payer: BC Managed Care – PPO | Admitting: Sports Medicine

## 2019-07-31 DIAGNOSIS — M5412 Radiculopathy, cervical region: Secondary | ICD-10-CM

## 2019-07-31 NOTE — Progress Notes (Signed)
Subjective:    CC: Follow-up  HPI: This is a pleasant 43 year old female, we are treating her for left C7 distribution radiculitis, she responded well to a single epidural and is eager to do #2 and #3.  She just finished her chemotherapy and her hair is growing back nicely.  I reviewed the past medical history, family history, social history, surgical history, and allergies today and no changes were needed.  Please see the problem list section below in epic for further details.  Past Medical History: Past Medical History:  Diagnosis Date  . Back pain    Past Surgical History: Past Surgical History:  Procedure Laterality Date  . LUMBAR DISC SURGERY     x2, L4-L5  . PATELLAR TENDON REPAIR Bilateral   . TOTAL ABDOMINAL HYSTERECTOMY  10/2018   Social History: Social History   Socioeconomic History  . Marital status: Single    Spouse name: Not on file  . Number of children: Not on file  . Years of education: Not on file  . Highest education level: Not on file  Occupational History  . Not on file  Tobacco Use  . Smoking status: Never Smoker  . Smokeless tobacco: Never Used  Substance and Sexual Activity  . Alcohol use: Yes    Alcohol/week: 2.0 - 3.0 standard drinks    Types: 2 - 3 Standard drinks or equivalent per week  . Drug use: Never  . Sexual activity: Yes    Birth control/protection: Other-see comments    Comment: same sex partner  Other Topics Concern  . Not on file  Social History Narrative  . Not on file   Social Determinants of Health   Financial Resource Strain:   . Difficulty of Paying Living Expenses: Not on file  Food Insecurity:   . Worried About Charity fundraiser in the Last Year: Not on file  . Ran Out of Food in the Last Year: Not on file  Transportation Needs:   . Lack of Transportation (Medical): Not on file  . Lack of Transportation (Non-Medical): Not on file  Physical Activity:   . Days of Exercise per Week: Not on file  . Minutes of  Exercise per Session: Not on file  Stress:   . Feeling of Stress : Not on file  Social Connections:   . Frequency of Communication with Friends and Family: Not on file  . Frequency of Social Gatherings with Friends and Family: Not on file  . Attends Religious Services: Not on file  . Active Member of Clubs or Organizations: Not on file  . Attends Archivist Meetings: Not on file  . Marital Status: Not on file   Family History: No family history on file. Allergies: No Known Allergies Medications: See med rec.  Review of Systems: No fevers, chills, night sweats, weight loss, chest pain, or shortness of breath.   Objective:    General: Well Developed, well nourished, and in no acute distress.  Neuro: Alert and oriented x3, extra-ocular muscles intact, sensation grossly intact.  HEENT: Normocephalic, atraumatic, pupils equal round reactive to light, neck supple, no masses, no lymphadenopathy, thyroid nonpalpable.  Skin: Warm and dry, no rashes. Cardiac: Regular rate and rhythm, no murmurs rubs or gallops, no lower extremity edema.  Respiratory: Clear to auscultation bilaterally. Not using accessory muscles, speaking in full sentences.  Impression and Recommendations:    Radiculitis of left cervical region Left C7 distribution radiculitis, better after C6-C7 left-sided cervical epidural, we will proceed with  epidurals #2 and #3 1 month apart. Return to see me in about 2 months. Continue tramadol, amitriptyline, gabapentin. She recently finished her chemotherapy and her hair is growing back nicely, she tells me she will have more hair than me when she sees me again.   ___________________________________________ Ihor Austin. Benjamin Stain, M.D., ABFM., CAQSM. Primary Care and Sports Medicine [facility]  Adjunct Professor of Family Medicine  [employer]

## 2019-07-31 NOTE — Assessment & Plan Note (Addendum)
Left C7 distribution radiculitis, better after C6-C7 left-sided cervical epidural, we will proceed with epidurals #2 and #3 1 month apart. Return to see me in about 2 months. Continue tramadol, amitriptyline, gabapentin. She recently finished her chemotherapy and her hair is growing back nicely, she tells me she will have more hair than me when she sees me again.

## 2019-08-09 ENCOUNTER — Ambulatory Visit
Admission: RE | Admit: 2019-08-09 | Discharge: 2019-08-09 | Disposition: A | Discharge status: Home / Self Care | Payer: BC Managed Care – PPO | Source: Ambulatory Visit | Attending: Sports Medicine | Admitting: Sports Medicine

## 2019-08-09 ENCOUNTER — Other Ambulatory Visit: Payer: Self-pay

## 2019-08-09 DIAGNOSIS — M5412 Radiculopathy, cervical region: Secondary | ICD-10-CM

## 2019-08-09 MED ORDER — IOPAMIDOL (ISOVUE-M 300) INJECTION 61%
1.0000 mL | Freq: Once | INTRAMUSCULAR | Status: AC | PRN
  Administered 2019-08-09: 08:00:00 1 mL via EPIDURAL

## 2019-08-09 MED ORDER — TRIAMCINOLONE ACETONIDE 40 MG/ML IJ SUSP (RADIOLOGY)
60.0000 mg | Freq: Once | INTRAMUSCULAR | Status: AC
  Administered 2019-08-09: 60 mg via EPIDURAL

## 2019-08-09 NOTE — Discharge Instructions (Signed)

## 2020-01-22 ENCOUNTER — Other Ambulatory Visit: Payer: Self-pay

## 2020-01-22 ENCOUNTER — Ambulatory Visit: Payer: BC Managed Care – PPO | Admitting: Sports Medicine

## 2020-01-22 ENCOUNTER — Encounter: Payer: Self-pay | Admitting: Sports Medicine

## 2020-01-22 DIAGNOSIS — R200 Anesthesia of skin: Secondary | ICD-10-CM | POA: Diagnosis not present

## 2020-01-22 DIAGNOSIS — R202 Paresthesia of skin: Secondary | ICD-10-CM | POA: Diagnosis not present

## 2020-01-22 DIAGNOSIS — M5412 Radiculopathy, cervical region: Secondary | ICD-10-CM

## 2020-01-22 NOTE — Assessment & Plan Note (Signed)
This pleasant 44 year old female has known cervical radiculitis, she had a left C7 distribution symptoms, better after a C6-C7 epidural, she has occasional tramadol, gabapentin. She would like to use some CBD oil, if this does not help for her neck pain then it is reasonable to try prescription strength ibuprofen per her request.

## 2020-01-22 NOTE — Assessment & Plan Note (Signed)
This is a very pleasant 44 year old female, she has a history of an ovarian cancer, she is post chemotherapy. Unfortunately she has developed numbness and tingling in both hands, mostly first and second fingers. Worse at night. This is concerning for carpal tunnel syndrome, she will get over-the-counter nighttime splints, wear them every single night. I would also like nerve conduction and EMG as she does have significant cervical degenerative processes, and this will certainly help me guide any future interventional procedure. Return to see me in a month.

## 2020-01-22 NOTE — Progress Notes (Addendum)
° ° °  Procedures performed today:    None.  Independent interpretation of notes and tests performed by another provider:   None.  Brief History, Exam, Impression, and Recommendations:    Numbness and tingling in both hands This is a very pleasant 44 year old female, she has a history of an ovarian cancer, she is post chemotherapy. Unfortunately she has developed numbness and tingling in both hands, mostly first and second fingers. Worse at night. This is concerning for carpal tunnel syndrome, she will get over-the-counter nighttime splints, wear them every single night. I would also like nerve conduction and EMG as she does have significant cervical degenerative processes, and this will certainly help me guide any future interventional procedure. Return to see me in a month.  Radiculitis of left cervical region This pleasant 43 year old female has known cervical radiculitis, she had a left C7 distribution symptoms, better after a C6-C7 epidural, she has occasional tramadol, gabapentin. She would like to use some CBD oil, if this does not help for her neck pain then it is reasonable to try prescription strength ibuprofen per her request.    ___________________________________________ Ihor Austin. Benjamin Stain, M.D., ABFM., CAQSM. Primary Care and Sports Medicine [facility]  Adjunct Instructor of Family Medicine  [employer]

## 2020-02-22 ENCOUNTER — Ambulatory Visit: Payer: BC Managed Care – PPO | Admitting: Sports Medicine

## 2020-02-25 ENCOUNTER — Other Ambulatory Visit: Payer: Self-pay

## 2020-02-25 DIAGNOSIS — R202 Paresthesia of skin: Secondary | ICD-10-CM

## 2020-03-18 ENCOUNTER — Other Ambulatory Visit: Payer: Self-pay

## 2020-03-18 ENCOUNTER — Encounter: Payer: BC Managed Care – PPO | Admitting: Neurology

## 2020-03-18 ENCOUNTER — Ambulatory Visit (INDEPENDENT_AMBULATORY_CARE_PROVIDER_SITE_OTHER): Payer: BC Managed Care – PPO | Admitting: Neurology

## 2020-03-18 DIAGNOSIS — M5412 Radiculopathy, cervical region: Secondary | ICD-10-CM

## 2020-03-18 DIAGNOSIS — M79602 Pain in left arm: Secondary | ICD-10-CM

## 2020-03-18 DIAGNOSIS — Z0289 Encounter for other administrative examinations: Secondary | ICD-10-CM

## 2020-03-18 DIAGNOSIS — G5601 Carpal tunnel syndrome, right upper limb: Secondary | ICD-10-CM

## 2020-03-18 NOTE — Progress Notes (Signed)
Full Name: Sophiana "Danella Deis" Frayne Gender: Female MRN #: [id number] Date of Birth: 03-20-1976    Visit Date: 03/18/2020 09:16 Age: 44 Years Examining Physician: Despina Arias, MD  Referring Physician: Rodney Langton, MD Height: 5 feet 11 inch     History: Ms. Bowne is a 44 year old woman with pain in the left arm that will radiate into the hand and pain and numbness in the right hand.  The left arm pain was worse but she has received some benefit from epidural steroid injections.  She notes mild weakness at times of the left arm.  She has some numbness in the left hand especially in the index finger.  On exam, tricep strength was 4+/5.  There was some altered sensation in the C7 and C6 distributions of the left hand.  Additionally, she had 4+/5 weakness in the right APB muscle and a Tinel's sign at the right wrist  Nerve conduction studies:  The right median motor response had a delayed distal latency with normal amplitude and conduction velocity.  The left median and both ulnar motor responses had normal distal latencies, amplitudes and conduction velocities.  The ulnar F-wave latencies were normal.  The right median sensory response had a delayed peak latency with reduced amplitude.  The left median and bilateral ulnar sensory responses had normal peak latencies and amplitudes.  Electromyography: Needle EMG of selected muscles of the left arm and right hand was performed.  In the left arm, 3 of the 4 C7 innervated muscles showed mild chronic denervation.  Other muscles tested were normal.  The right APB muscle showed borderline chronic denervation.  There was no abnormal spontaneous activity.  Impression: This NCV/EMG study shows the following: 1.   Left chronic C7 radiculopathy without active features. 2.   Moderate right median neuropathy at the wrist (carpal tunnel syndrome).  Hajer Dwyer A. Epimenio Foot, MD, PhD, FAAN Certified in Neurology, Clinical Neurophysiology, Sleep Medicine,  Pain Medicine and Neuroimaging Director, [facility]  [facility] [address], [address] [place], [place] [address] [phone]  Clinical note: I advised her to get a carpal tunnel wrist splint for the right arm. -- RAS  Verbal informed consent was obtained from the patient, patient was informed of potential risk of procedure, including bruising, bleeding, hematoma formation, infection, muscle weakness, muscle pain, numbness, among others.         MNC    Nerve / Sites Muscle Latency Ref. Amplitude Ref. Rel Amp Segments Distance Velocity Ref. Area    ms ms mV mV %  cm m/s m/s mVms  R Median - APB     Wrist APB 5.4 ?4.4 7.0 ?4.0 100 Wrist - APB 7   24.7     Upper arm APB 9.9  6.2  87.9 Upper arm - Wrist 24 54 ?49 23.5  L Median - APB     Wrist APB 3.8 ?4.4 5.8 ?4.0 100 Wrist - APB 7   21.7     Upper arm APB 8.2  5.3  91 Upper arm - Wrist 25 57 ?49 21.7  R Ulnar - ADM     Wrist ADM 2.3 ?3.3 11.8 ?6.0 100 Wrist - ADM 7   30.6     B.Elbow ADM 6.1  10.6  89.7 B.Elbow - Wrist 24 62 ?49 29.5     A.Elbow ADM 7.8  9.9  93.3 A.Elbow - B.Elbow 10 62 ?49 29.0  A.Elbow - Wrist      L Ulnar - ADM     Wrist ADM 2.4 ?3.3 9.7 ?6.0 100 Wrist - ADM 7   31.9     B.Elbow ADM 6.4  9.3  95 B.Elbow - Wrist 23 58 ?49 30.8     A.Elbow ADM 8.3  8.8  95.6 A.Elbow - B.Elbow 10 53 ?49 30.7         A.Elbow - Wrist                 SNC    Nerve / Sites Rec. Site Peak Lat Ref.  Amp Ref. Segments Distance Peak Diff Ref.    ms ms V V  cm ms ms  L Median, Ulnar - Transcarpal comparison     Median Palm Wrist 2.2 ?2.2 119 ?35 Median Palm - Wrist 8       Ulnar Palm Wrist 2.0 ?2.2 34 ?12 Ulnar Palm - Wrist 8          Median Palm - Ulnar Palm  0.2 ?0.4  R Median - Orthodromic (Dig II, Mid palm)     Dig II Wrist 4.9 ?3.4 5 ?10 Dig II - Wrist 13    L Median - Orthodromic (Dig II, Mid palm)     Dig II Wrist 3.3 ?3.4 21 ?10 Dig II - Wrist 13     R Ulnar - Orthodromic, (Dig V, Mid palm)     Dig V Wrist 2.7 ?3.1 10 ?5 Dig V - Wrist 11    L Ulnar - Orthodromic, (Dig V, Mid palm)     Dig V Wrist 2.8 ?3.1 16 ?5 Dig V - Wrist 35                 F  Wave    Nerve F Lat Ref.   ms ms  R Ulnar - ADM 29.5 ?32.0  L Ulnar - ADM 30.1 ?32.0         EMG Summary Table    Spontaneous MUAP Recruitment  Muscle IA Fib PSW Fasc Other Amp Dur. Poly Pattern  L. Deltoid Normal None None None _______ Normal Normal Normal Normal  L. Triceps brachii Normal None None None _______ Normal Increased 1+ Reduced  L. Biceps brachii Normal None None None _______ Normal Normal Normal Normal  L. Extensor digitorum communis Normal None None None _______ Normal Increased 1+ Reduced  L. Brachioradialis Normal None None None _______ Normal Normal Normal Normal  L. Flexor carpi ulnaris Normal None None None _______ Normal Normal 1+    Reduced  L. First dorsal interosseous Normal None None None _______ Normal Normal Normal Normal  L. Abductor pollicis brevis Normal None None None _______ Normal Normal Normal Normal  R. Abductor pollicis brevis Normal None None None _______ Increased Increased 1+ Bord Red.

## 2020-06-30 ENCOUNTER — Ambulatory Visit (INDEPENDENT_AMBULATORY_CARE_PROVIDER_SITE_OTHER): Payer: BC Managed Care – PPO

## 2020-06-30 ENCOUNTER — Other Ambulatory Visit: Payer: Self-pay

## 2020-06-30 ENCOUNTER — Ambulatory Visit (INDEPENDENT_AMBULATORY_CARE_PROVIDER_SITE_OTHER): Payer: BC Managed Care – PPO | Admitting: Sports Medicine

## 2020-06-30 DIAGNOSIS — W109XXA Fall (on) (from) unspecified stairs and steps, initial encounter: Secondary | ICD-10-CM

## 2020-06-30 DIAGNOSIS — M7701 Medial epicondylitis, right elbow: Secondary | ICD-10-CM | POA: Diagnosis not present

## 2020-06-30 DIAGNOSIS — M25561 Pain in right knee: Secondary | ICD-10-CM | POA: Diagnosis not present

## 2020-06-30 DIAGNOSIS — S82001A Unspecified fracture of right patella, initial encounter for closed fracture: Secondary | ICD-10-CM

## 2020-06-30 MED ORDER — MELOXICAM 15 MG PO TABS: ORAL_TABLET | ORAL | 3 refills | Status: DC

## 2020-06-30 NOTE — Assessment & Plan Note (Signed)
This is a pleasant 44 year old female, she has a history of bilateral patellar tendon reconstructions. She was going down the steps about a week ago, took a misstep and twisted her knee, she had immediate pain, swelling, instability. Today on exam she has laxity to her LCL, ACL, pain with terminal flexion and a positive McMurray's is suspicious for meniscal injury. Because of concern for internal ligamentous rupture and abnormal physical exam we are going to proceed with x-rays and MRI, meloxicam for pain.

## 2020-06-30 NOTE — Assessment & Plan Note (Signed)
Classic medial epicondylitis, elbow sleeve, meloxicam, rehab exercises, return in a month, injection if no better.

## 2020-06-30 NOTE — Progress Notes (Signed)
    Procedures performed today:    None.  Independent interpretation of notes and tests performed by another provider:   None.  Brief History, Exam, Impression, and Recommendations:    Right knee pain This is a pleasant 44 year old female, she has a history of bilateral patellar tendon reconstructions. She was going down the steps about a week ago, took a misstep and twisted her knee, she had immediate pain, swelling, instability. Today on exam she has laxity to her LCL, ACL, pain with terminal flexion and a positive McMurray's is suspicious for meniscal injury. Because of concern for internal ligamentous rupture and abnormal physical exam we are going to proceed with x-rays and MRI, meloxicam for pain.  Golfer's elbow, right Classic medial epicondylitis, elbow sleeve, meloxicam, rehab exercises, return in a month, injection if no better.    ___________________________________________ Ihor Austin. Benjamin Stain, M.D., ABFM., CAQSM. Primary Care and Sports Medicine [facility]  Adjunct Instructor of Family Medicine  [employer]

## 2020-07-05 ENCOUNTER — Other Ambulatory Visit: Payer: BC Managed Care – PPO

## 2020-12-15 ENCOUNTER — Other Ambulatory Visit: Payer: Self-pay

## 2020-12-15 ENCOUNTER — Encounter: Payer: Self-pay | Admitting: Medical-Surgical

## 2020-12-15 ENCOUNTER — Ambulatory Visit (INDEPENDENT_AMBULATORY_CARE_PROVIDER_SITE_OTHER): Payer: BC Managed Care – PPO | Admitting: Medical-Surgical

## 2020-12-15 VITALS — BP 127/85 | HR 48 | Temp 98.9°F | Wt 185.0 lb

## 2020-12-15 DIAGNOSIS — Z113 Encounter for screening for infections with a predominantly sexual mode of transmission: Secondary | ICD-10-CM | POA: Diagnosis not present

## 2020-12-15 DIAGNOSIS — I890 Lymphedema, not elsewhere classified: Secondary | ICD-10-CM

## 2020-12-15 DIAGNOSIS — R600 Localized edema: Secondary | ICD-10-CM | POA: Diagnosis not present

## 2020-12-15 DIAGNOSIS — R202 Paresthesia of skin: Secondary | ICD-10-CM | POA: Diagnosis not present

## 2020-12-15 DIAGNOSIS — R200 Anesthesia of skin: Secondary | ICD-10-CM | POA: Insufficient documentation

## 2020-12-15 LAB — POCT URINALYSIS DIP (CLINITEK)
Bilirubin, UA: NEGATIVE
Blood, UA: NEGATIVE
Glucose, UA: NEGATIVE mg/dL
Ketones, POC UA: NEGATIVE mg/dL
Leukocytes, UA: NEGATIVE
Nitrite, UA: NEGATIVE
POC PROTEIN,UA: NEGATIVE
Spec Grav, UA: >=1.03 — AB (ref 1.010–1.025)
Urobilinogen, UA: 0.2 E.U./dL
pH, UA: 5.5 (ref 5.0–8.0)

## 2020-12-15 NOTE — Progress Notes (Signed)
Subjective:    CC: bilateral lower extremity edema and numbness  HPI: Pleasant 45 year old female presenting to establish care with a new PCP and for evaluation of bilateral lower extremity edema with paresthesias. This has been going on for months and she assumed it was related to her prior chemotherapy and cancer treatments. When she recently saw someone for her knee issue, they advised that she get checked out for possible other causes of swelling. Notes that she feels fine overall but her feet/toes get numb after sitting too long and at night. Swelling of the lower extremities is constant, pitting on the lower leg and ankle. Wearing knee high compression socks but not sure if they are making a difference.  Avoids adding salt to foods but is not sure that she is following a lower sodium diet.  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  Tolerates exercise very well without chest pain, shortness of breath, or excessive fatigue.  Requesting STI testing today.  Reports she recently broke up with her girlfriend after finding that she had multiple other partners.  I reviewed the past medical history, family history, social history, surgical history, and allergies today and no changes were needed.  Please see the problem list section below in epic for further details.  Past Medical History: Past Medical History:  Diagnosis Date  . Back pain    Past Surgical History: Past Surgical History:  Procedure Laterality Date  . LUMBAR DISC SURGERY     x2, L4-L5  . PATELLAR TENDON REPAIR Bilateral   . TOTAL ABDOMINAL HYSTERECTOMY  10/2018   Social History: Social History   Socioeconomic History  . Marital status: Single    Spouse name: Not on file  . Number of children: Not on file  . Years of education: Not on file  . Highest education level: Not on file  Occupational History  . Not on file  Tobacco Use  . Smoking status: Never Smoker  . Smokeless  tobacco: Never Used  Substance and Sexual Activity  . Alcohol use: Yes    Alcohol/week: 2.0 - 3.0 standard drinks    Types: 2 - 3 Standard drinks or equivalent per week  . Drug use: Never  . Sexual activity: Yes    Birth control/protection: Other-see comments    Comment: same sex partner  Other Topics Concern  . Not on file  Social History Narrative  . Not on file   Social Determinants of Health   Financial Resource Strain: Not on file  Food Insecurity: Not on file  Transportation Needs: Not on file  Physical Activity: Not on file  Stress: Not on file  Social Connections: Not on file   Family History: No family history on file. Allergies: No Known Allergies Medications: See med rec.  Review of Systems: See HPI for pertinent positives and negatives.   Objective:    General: Well Developed, well nourished, and in no acute distress.  Neuro: Alert and oriented x3.  HEENT: Normocephalic, atraumatic.  Skin: Warm and dry. Cardiac: Regular rate and rhythm, no murmurs rubs or gallops, + lower extremity edema.  Respiratory: Clear to auscultation bilaterally. Not using accessory muscles, speaking in full sentences.   Impression and Recommendations:    1. Routine screening for STI (sexually transmitted infection) Checking urine for chlamydia and gonorrhea.  We will also draw blood for RPR, HIV, hepatitis B, hepatitis C, and HSV 1/2. - RPR - HIV Antibody (routine testing w rflx) - Hepatitis  B surface antigen - C. trachomatis/N. gonorrhoeae RNA - HSV(herpes simplex vrs) 1+2 ab-IgG - Hepatitis C Antibody  2. Bilateral lower extremity edema 3. Numbness and tingling of both legs below knees Unclear etiology.  Consider relation to chemo/cancer treatments versus peripheral neuropathy. Checking CBC with differential, CMP, TSH, ESR, vitamin B12, and POCT UA. - CBC with Differential/Platelet - COMPLETE METABOLIC PANEL WITH GFR - TSH - Sed Rate (ESR) - Vitamin B12 - POCT  Urinalysis Dipstick  Return if symptoms worsen or fail to improve. ___________________________________________ Clearnce Sorrel, DNP, APRN, FNP-BC Primary Care and [place]

## 2020-12-16 LAB — URINALYSIS, ROUTINE W REFLEX MICROSCOPIC
Bilirubin Urine: NEGATIVE
Glucose, UA: NEGATIVE
Hgb urine dipstick: NEGATIVE
Ketones, ur: NEGATIVE
Leukocytes,Ua: NEGATIVE
Nitrite: NEGATIVE
Protein, ur: NEGATIVE
Specific Gravity, Urine: 1.022 (ref 1.001–1.035)
pH: 5.5 (ref 5.0–8.0)

## 2020-12-16 LAB — C. TRACHOMATIS/N. GONORRHOEAE RNA
C. trachomatis RNA, TMA: NOT DETECTED
N. gonorrhoeae RNA, TMA: NOT DETECTED

## 2020-12-17 LAB — HEPATITIS C ANTIBODY
Hepatitis C Ab: NONREACTIVE
SIGNAL TO CUT-OFF: 0.01 (ref <1.00)

## 2020-12-17 LAB — COMPLETE METABOLIC PANEL WITH GFR
AG Ratio: 1.8 (calc) (ref 1.0–2.5)
ALT: 15 U/L (ref 6–29)
AST: 24 U/L (ref 10–35)
Albumin: 4.7 g/dL (ref 3.6–5.1)
Alkaline phosphatase (APISO): 58 U/L (ref 31–125)
BUN: 14 mg/dL (ref 7–25)
CO2: 31 mmol/L (ref 20–32)
Calcium: 10.6 mg/dL — ABNORMAL HIGH (ref 8.6–10.2)
Chloride: 101 mmol/L (ref 98–110)
Creat: 0.88 mg/dL (ref 0.50–1.10)
GFR, Est African American: 91 mL/min/{1.73_m2} (ref >=60)
GFR, Est Non African American: 78 mL/min/{1.73_m2} (ref >=60)
Globulin: 2.6 g/dL (calc) (ref 1.9–3.7)
Glucose, Bld: 86 mg/dL (ref 65–99)
Potassium: 4.6 mmol/L (ref 3.5–5.3)
Sodium: 139 mmol/L (ref 135–146)
Total Bilirubin: 0.6 mg/dL (ref 0.2–1.2)
Total Protein: 7.3 g/dL (ref 6.1–8.1)

## 2020-12-17 LAB — HSV(HERPES SIMPLEX VRS) I + II AB-IGG
HAV 1 IGG,TYPE SPECIFIC AB: 38.3 index — ABNORMAL HIGH
HSV 2 IGG,TYPE SPECIFIC AB: <0.9 index

## 2020-12-17 LAB — CBC WITH DIFFERENTIAL/PLATELET
Absolute Monocytes: 350 cells/uL (ref 200–950)
Basophils Absolute: 19 cells/uL (ref 0–200)
Basophils Relative: 0.4 %
Eosinophils Absolute: 38 cells/uL (ref 15–500)
Eosinophils Relative: 0.8 %
HCT: 41.6 % (ref 35.0–45.0)
Hemoglobin: 13.4 g/dL (ref 11.7–15.5)
Lymphs Abs: 1248 cells/uL (ref 850–3900)
MCH: 28.9 pg (ref 27.0–33.0)
MCHC: 32.2 g/dL (ref 32.0–36.0)
MCV: 89.8 fL (ref 80.0–100.0)
MPV: 11.8 fL (ref 7.5–12.5)
Monocytes Relative: 7.3 %
Neutro Abs: 3144 cells/uL (ref 1500–7800)
Neutrophils Relative %: 65.5 %
Platelets: 216 10*3/uL (ref 140–400)
RBC: 4.63 10*6/uL (ref 3.80–5.10)
RDW: 12.4 % (ref 11.0–15.0)
Total Lymphocyte: 26 %
WBC: 4.8 10*3/uL (ref 3.8–10.8)

## 2020-12-17 LAB — LIPID PANEL
Cholesterol: 199 mg/dL (ref <200)
HDL: 96 mg/dL (ref >=50)
LDL Cholesterol (Calc): 89 mg/dL (calc)
Non-HDL Cholesterol (Calc): 103 mg/dL (calc) (ref <130)
Total CHOL/HDL Ratio: 2.1 (calc) (ref <5.0)
Triglycerides: 53 mg/dL (ref <150)

## 2020-12-17 LAB — TSH: TSH: 1.16 mIU/L

## 2020-12-17 LAB — HIV ANTIBODY (ROUTINE TESTING W REFLEX): HIV 1&2 Ab, 4th Generation: NONREACTIVE

## 2020-12-17 LAB — VITAMIN B12: Vitamin B-12: 613 pg/mL (ref 200–1100)

## 2020-12-17 LAB — SEDIMENTATION RATE: Sed Rate: 2 mm/h (ref 0–20)

## 2020-12-17 LAB — RPR: RPR Ser Ql: NONREACTIVE

## 2020-12-17 LAB — HEPATITIS B SURFACE ANTIGEN: Hepatitis B Surface Ag: NONREACTIVE

## 2021-01-02 ENCOUNTER — Other Ambulatory Visit: Payer: Self-pay

## 2021-01-02 ENCOUNTER — Ambulatory Visit: Payer: BC Managed Care – PPO | Attending: Medical-Surgical | Admitting: Physical Therapy

## 2021-01-02 DIAGNOSIS — I890 Lymphedema, not elsewhere classified: Secondary | ICD-10-CM | POA: Diagnosis present

## 2021-01-02 NOTE — Therapy (Signed)
[facility] [address] [place], [place], [address] Phone: [phone]   Fax:  [phone]  Physical Therapy Evaluation  Patient Details  Name: Tammie Jones MRN: [id number] Date of Birth: 11-Jul-1976 Referring Provider (PT): Christen Butter   Encounter Date: 01/02/2021   PT End of Session - 01/02/21 1234    Visit Number 1    Number of Visits 8    Date for PT Re-Evaluation 03/04/21    PT Start Time 0800    PT Stop Time 0845    PT Time Calculation (min) 45 min    Activity Tolerance Patient tolerated treatment well    Behavior During Therapy [facility] for tasks assessed/performed           Past Medical History:  Diagnosis Date  . Back pain     Past Surgical History:  Procedure Laterality Date  . LUMBAR DISC SURGERY     x2, L4-L5  . PATELLAR TENDON REPAIR Bilateral   . TOTAL ABDOMINAL HYSTERECTOMY  10/2018    There were no vitals filed for this visit.    Subjective Assessment - 01/02/21 0803    Subjective Pt reports she has had swelling and pain in her legs with dif    Pertinent History ovarian cancer in 2020 with full robotic full hysterectomy and chemotherap.  She has bilateral patellar tendon repairs and back surgery in 1998 with foot drop and numbness in her left side of her leg since thn    Patient Stated Goals for the swelling to go away and for her legs to feel better especially at night when she is sleeping.    Currently in Pain? No/denies   feet are a little tingly             [facility] PT Assessment - 01/02/21 0001      Assessment   Medical Diagnosis ovarian cancer    Referring Provider (PT) Joy Jessup    Onset Date/Surgical Date 11/06/18    Hand Dominance Right      Precautions   Precautions Other (comment)    Precaution Comments at risk for lymphedema    Required Braces or Orthoses Other Brace/Splint    Other Brace/Splint has an AFO, but only wears it when she is active      Restrictions   Weight  Bearing Restrictions No      Balance Screen   Has the patient fallen in the past 6 months Yes    How many times? trips all the time because of foot drop    Has the patient had a decrease in activity level because of a fear of falling?  No    Is the patient reluctant to leave their home because of a fear of falling?  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 She does not have swelling inher feet or ankles.  She ususally wears lace up athletic shoes             LYMPHEDEMA/ONCOLOGY QUESTIONNAIRE - 01/02/21 0001      Type   Cancer Type ovarian cancer      Surgeries   Other Surgery Date 11/06/18      Treatment   Past Chemotherapy Treatment Yes      What other symptoms do you have   Are you Having Heaviness or Tightness Yes    Are you having Pain Yes    Are you having pitting edema No      Lymphedema Assessments   Lymphedema Assessments Lower extremities      Right Lower Extremity Lymphedema   20 cm Proximal to Suprapatella 61 cm    10 cm Proximal to Suprapatella 53 cm    At Midpatella/Popliteal Crease 42 cm    30 cm Proximal to Floor at Lateral Plantar Foot 40 cm    20 cm Proximal to Floor at Lateral Plantar Foot 35 1    10 cm Proximal to Floor at Lateral Malleoli 27.5 cm    5 cm Proximal to 1st MTP Joint 23.5  cm    Around Proximal Great Toe 7.5 cm      Left Lower Extremity Lymphedema   20 cm Proximal to Suprapatella 62 cm    10 cm Proximal to Suprapatella 53 cm    At Midpatella/Popliteal Crease 42 cm    30 cm Proximal to Floor at Lateral Plantar Foot 37 cm    20 cm Proximal to Floor at Lateral Plantar Foot 28 cm    10 cm Proximal to Floor at Lateral Malleoli 22 cm    5 cm Proximal to 1st MTP Joint 22 cm    Around Proximal Great Toe 7 cm                   Objective measurements completed on examination: See above findings.                    PT Long Term Goals - 01/02/21 1247      PT LONG TERM GOAL #1   Title Pt will be independent in managing lymphedem in her legs with self MLD and compression bandaging or use of compression garments.    Time 8    Period Weeks    Status New      PT LONG TERM GOAL #2   Title Pt will have decrease in right calf at 30 cm proximal ot the floor at lateral foot by 1 cm    Time 8    Period Weeks    Status New                  Plan - 01/02/21 1234    Clinical Impression Statement Pt comes in to clinic with concerns of pain, numbness, tingling in her legs and feet and swelling in her calves,(  right > left , thought her left left is smaller due to muscle atrophy due to a foot drop from back surgery in the remote past) .  She was educated that lymphedema is not a painful condition and that her pain symptoms might be coming from chemotherapy or from her back. She was encouraged to add stretching to her exercise program and follow up with MD. Both her legs were measured circumferentially and she has significant  increase in right calf with palpable bogginess to the tissue left more that right, but possible in both legs. She does not have fullness or asymmetry above the knee  She was educated in lymphedema presentation,  that it can be a progressive condidtion, and the components of complete decongestive therapy: good skin care,  manual lymph drainage, compression and exercise.  Feel pt would benefit from instruction in manual lymph drainage and compression bandaging followed by measurement for flat knit below the knee stockings and possible night time garments once she if she acheived reduction. I feel she has good potential to learn how to do compressio bandaging for herself. She permitted demographics to be sent off to see if she can get insurance coverage for garments. Pt will schedule for visits as her time permits.    Personal Factors and Comorbidities Comorbidity 3+    Comorbidities ovarian cancer with surgery and chemo,, bilateral patellar tendon repair, back surgery with foot drop    Stability/Clinical Decision Making Stable/Uncomplicated    Clinical Decision Making Low    Rehab Potential Good    PT Frequency 2x / week   pt only needs visits until she can perform MLD and bandaging on her own   PT Duration 8 weeks    PT Treatment/Interventions ADLs/Self Care Home Management;Orthotic Fit/Training;Patient/family education;Manual lymph drainage;Compression bandaging;Taping    PT Next Visit Plan teach and perform MLD benefits check for garments?  perform and teach compression wrapping??    Consulted and Agree with Plan of Care Patient           Patient will benefit from skilled therapeutic intervention in order to improve the following deficits and impairments:     Visit Diagnosis: Lymphedema, not elsewhere classified - Plan: PT plan of care cert/re-cert     Problem List Patient Active Problem List   Diagnosis Date Noted  . Bilateral lower extremity edema 12/15/2020  . Numbness and tingling of both legs below knees 12/15/2020  . Right knee pain 06/30/2020  . Golfer's elbow, right 06/30/2020  . Pain of left upper extremity 03/18/2020  . Carpal tunnel syndrome of right wrist 03/18/2020  . Numbness and tingling in both hands 01/22/2020  . Cervical radiculopathy 05/22/2019  . Bacterial vaginosis 05/30/2018    Bayard Hugger. Manson Passey PT  Donnetta Hail 01/02/2021, 12:54 PM  [facility] [address] [place], [place], [address] Phone: [phone]   Fax:  [phone]  Name: Simar Pothier MRN: [id number] Date of Birth: 1975/12/01

## 2021-01-30 ENCOUNTER — Encounter: Payer: BC Managed Care – PPO | Admitting: Physical Therapy

## 2021-02-04 ENCOUNTER — Other Ambulatory Visit: Payer: Self-pay

## 2021-02-04 ENCOUNTER — Ambulatory Visit: Payer: BC Managed Care – PPO | Attending: Medical-Surgical

## 2021-02-04 DIAGNOSIS — I890 Lymphedema, not elsewhere classified: Secondary | ICD-10-CM

## 2021-02-04 NOTE — Patient Instructions (Signed)
Start with circles near the neck placing hands on collarbones and scooping towards neck 10 times each side.   Deep Effective Breath   Standing, sitting, or laying down place both hands on the belly. Take a deep breath IN, expanding the belly; then breath OUT, contracting the belly. Repeat __5__ times. Do __2-3__ sessions per day and before each self massage.  Inguinal Nodes to Axilla - Clear   On involved side, at armpit, make _5__ in-place circles. Then from hip proceed in sections to armpit with stationary circles or pumps _5_ times, this is your pathway. Do _1__ time per day.  Copyright  VHI. All rights reserved.  LEG: Knee to Hip - Clear   Pump up outer thigh of involved leg from knee to outer hip. Then do stationary circles from inner to outer thigh, then do outer thigh again. Next, interlace fingers behind knee IF ABLE and make in-place circles. Do _5_ times of each sequence.  Do _1__ time per day.  Copyright  VHI. All rights reserved.  LEG: Ankle to Hip Sweep   Hands on sides of ankle of involved leg, pump _5__ times up both sides of lower leg, then retrace steps up outer thigh to hip as before and back to pathway. Do _2-3_ times. Do __1_ time per day.  Copyright  VHI. All rights reserved.  FOOT: Dorsum of Foot and Toes Massage   One hand on top of foot make _5_ stationary circles or pumps, then either on top of toes or each individual toe do _5_ pumps. Then retrace all steps pumping back up both sides of lower leg, outer thigh, and then pathway. Finish with what you started with, _5_ circles at involved side arm pit. All _2-3_ times at each sequence. Do _1__ time per day.

## 2021-02-04 NOTE — Therapy (Signed)
[facility] [address] [place], [place], [address] Phone: [phone]   Fax:  [phone]  Physical Therapy Treatment  Patient Details  Name: Tammie Jones MRN: [id number] Date of Birth: Dec 25, 1975 Referring Provider (PT): Christen Butter   Encounter Date: 02/04/2021   PT End of Session - 02/04/21 1018     Visit Number 2    Number of Visits 8    Date for PT Re-Evaluation 03/04/21    PT Start Time 0913   pt arrived late   PT Stop Time 1009    PT Time Calculation (min) 56 min    Activity Tolerance Patient tolerated treatment well    Behavior During Therapy [facility] for tasks assessed/performed             Past Medical History:  Diagnosis Date   Back pain     Past Surgical History:  Procedure Laterality Date   LUMBAR DISC SURGERY     x2, L4-L5   PATELLAR TENDON REPAIR Bilateral    TOTAL ABDOMINAL HYSTERECTOMY  10/2018    There were no vitals filed for this visit.   Subjective Assessment - 02/04/21 0915     Subjective Nothing new since I was here for the eval.    Pertinent History ovarian cancer in 2020 with full robotic full hysterectomy and chemotherap.  She has bilateral patellar tendon repairs and back surgery in 1998 with foot drop and numbness in her left side of her leg since thn    Patient Stated Goals for the swelling to go away and for her legs to feel better especially at night when she is sleeping.    Currently in Pain? No/denies                               [facility] Adult PT Treatment/Exercise - 02/04/21 0001       Manual Therapy   Manual Therapy Manual Lymphatic Drainage (MLD)    Manual Lymphatic Drainage (MLD) In supine: Short neck, 5 diaphragmatic breaths, Rt axillary nodes, Rt inguino-axillary anastomosis, then Rt LE working from proximal to distal then retracing all steps beginning to instruct pt in this while performing and having her return demo of each step.                     PT Education - 02/04/21 1009     Education Details Self MLD of Rt LE    Person(s) Educated Patient    Methods Explanation;Demonstration;Tactile cues;Verbal cues;Handout    Comprehension Verbalized understanding;Returned demonstration;Tactile cues required;Need further instruction                 PT Long Term Goals - 01/02/21 1247       PT LONG TERM GOAL #1   Title Pt will be independent in managing lymphedem in her legs with self MLD and compression bandaging or use of compression garments.    Time 8    Period Weeks    Status New      PT LONG TERM GOAL #2   Title Pt will have decrease in right calf at 30 cm proximal ot the floor at lateral foot by 1 cm    Time 8    Period Weeks    Status New                   Plan - 02/04/21 1018     Clinical Impression Statement  Began session with educating pt about basics of anatomy of lymphatic system. Then educated her in self manual lymph drainage of her Rt LE, instructing her that she could do the same for her Lt as well, while having her return demonstration of each step in the sequence. Pt was able to return fairly good dmeo with some hand over hand cuing required for correct direction of stationary circles and for no sliding on skin at end of skin stretch. She was able to verblaize good understanding of basic principles of MLD by end of session. Handout issued.    Personal Factors and Comorbidities Comorbidity 3+    Comorbidities ovarian cancer with surgery and chemo,, bilateral patellar tendon repair, back surgery with foot drop    Stability/Clinical Decision Making Stable/Uncomplicated    Rehab Potential Good    PT Frequency 2x / week   pt only needs visits until she can perform MLD and bandaging on her own   PT Duration 8 weeks    PT Treatment/Interventions ADLs/Self Care Home Management;Orthotic Fit/Training;Patient/family education;Manual lymph drainage;Compression bandaging;Taping    PT Next Visit  Plan Benefits check for garments? ; briefly review MLD next and then instruct in compression bandaging to Rt knee    PT Home Exercise Plan Self MLD daily    Consulted and Agree with Plan of Care Patient             Patient will benefit from skilled therapeutic intervention in order to improve the following deficits and impairments:     Visit Diagnosis: Lymphedema, not elsewhere classified     Problem List Patient Active Problem List   Diagnosis Date Noted   Bilateral lower extremity edema 12/15/2020   Numbness and tingling of both legs below knees 12/15/2020   Right knee pain 06/30/2020   Golfer's elbow, right 06/30/2020   Pain of left upper extremity 03/18/2020   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 [place], [place], [address] Phone: [phone]   Fax:  [phone]  Name: Tammie Jones MRN: [id number] Date of Birth: May 21, 1976

## 2021-02-06 ENCOUNTER — Encounter: Payer: BC Managed Care – PPO | Admitting: Physical Therapy

## 2021-02-20 ENCOUNTER — Other Ambulatory Visit: Payer: Self-pay

## 2021-02-20 ENCOUNTER — Ambulatory Visit: Payer: BC Managed Care – PPO | Attending: Medical-Surgical | Admitting: Physical Therapy

## 2021-02-20 DIAGNOSIS — R293 Abnormal posture: Secondary | ICD-10-CM

## 2021-02-20 DIAGNOSIS — I890 Lymphedema, not elsewhere classified: Secondary | ICD-10-CM | POA: Diagnosis present

## 2021-02-20 NOTE — Therapy (Signed)
[facility] [address] [place], [place], [address] Phone: [phone]   Fax:  [phone]  Physical Therapy Treatment  Patient Details  Name: Tammie Jones MRN: [id number] Date of Birth: 1976-04-29 Referring Provider (PT): Christen Butter   Encounter Date: 02/20/2021   PT End of Session - 02/20/21 1143     Visit Number 3    Number of Visits 8    Date for PT Re-Evaluation 03/04/21    PT Start Time 0800    PT Stop Time 0855    PT Time Calculation (min) 55 min    Behavior During Therapy [facility] for tasks assessed/performed             Past Medical History:  Diagnosis Date   Back pain     Past Surgical History:  Procedure Laterality Date   LUMBAR DISC SURGERY     x2, L4-L5   PATELLAR TENDON REPAIR Bilateral    TOTAL ABDOMINAL HYSTERECTOMY  10/2018    There were no vitals filed for this visit.   Subjective Assessment - 02/20/21 0811     Subjective Pt has been doing the self MLD and she thinks she is doing alright, but she still feels like the swelling is present in both legs It pretty much stays the same. She has been wearing the lower leg comprssion stockings for several months without significant improvement    Pertinent History ovarian cancer in 2020 with full robotic full hysterectomy and chemotherap.  She has bilateral patellar tendon repairs and back surgery in 1998 with foot drop and numbness in her left side of her leg since thn    Patient Stated Goals for the swelling to go away and for her legs to feel better especially at night when she is sleeping.    Currently in Pain? No/denies                               [facility] Adult PT Treatment/Exercise - 02/20/21 0001       Manual Therapy   Manual Therapy Edema management;Manual Lymphatic Drainage (MLD)    Edema Management discussed treatment plan with pt and she will try to come 2x a week for the next several weeks to get reduction and learn  compression bandaging , order necessary garments. PT okay to send demographics to [facility] and [employer] to check benefits.    Manual Lymphatic Drainage (MLD) In supine: Short neck, 5 diaphragmatic breaths, Rt axillary nodes, Rt inguino-axillary anastomosis, then Rt LE working from proximal to distal then retracing all steps, then, same to left leg                         PT Long Term Goals - 01/02/21 1247       PT LONG TERM GOAL #1   Title Pt will be independent in managing lymphedem in her legs with self MLD and compression bandaging or use of compression garments.    Time 8    Period Weeks    Status New      PT LONG TERM GOAL #2   Title Pt will have decrease in right calf at 30 cm proximal ot the floor at lateral foot by 1 cm    Time 8    Period Weeks    Status New  Plan - 02/20/21 1143     Clinical Impression Statement Pt coninues to have lymphedema in her legs (right greater than left ) depsite self manual lymph drainag, use of compression and exercise and elevation for more than 4 weeks.  She has palpable lymphostatic fibrosis hyperplasia especially in her right medial posterior thigh and calf.  She will need compression bandaging to groin on the right and will begin that next session. Pt to go see her oncologist today    Personal Factors and Comorbidities Comorbidity 3+    Comorbidities ovarian cancer with surgery and chemo,, bilateral patellar tendon repair, back surgery with foot drop    Stability/Clinical Decision Making Stable/Uncomplicated    Rehab Potential Good    PT Frequency 2x / week    PT Duration 8 weeks    PT Treatment/Interventions ADLs/Self Care Home Management;Orthotic Fit/Training;Patient/family education;Manual lymph drainage;Compression bandaging;Taping    PT Next Visit Plan results from [facility] and tact[employer]>  ; briefly review MLD next and then instruct in compression bandaging to Rt thigh    PT Home Exercise Plan Self MLD  daily    Consulted and Agree with Plan of Care Patient             Patient will benefit from skilled therapeutic intervention in order to improve the following deficits and impairments:  Increased edema, Decreased knowledge of precautions, Decreased knowledge of use of DME  Visit Diagnosis: Lymphedema, not elsewhere classified  Abnormal posture     Problem List Patient Active Problem List   Diagnosis Date Noted   Bilateral lower extremity edema 12/15/2020   Numbness and tingling of both legs below knees 12/15/2020   Right knee pain 06/30/2020   Golfer's elbow, right 06/30/2020   Pain of left upper extremity 03/18/2020   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 Manson Passey PT  Donnetta Hail 02/20/2021, 11:49 AM  [facility] [address] [place], [place], [address] Phone: [phone]   Fax:  [phone]  Name: Tammie Jones MRN: [id number] Date of Birth: Feb 10, 1976

## 2021-02-27 ENCOUNTER — Ambulatory Visit: Payer: BC Managed Care – PPO | Admitting: Physical Therapy

## 2021-02-27 ENCOUNTER — Other Ambulatory Visit: Payer: Self-pay

## 2021-02-27 DIAGNOSIS — I890 Lymphedema, not elsewhere classified: Secondary | ICD-10-CM

## 2021-02-27 NOTE — Patient Instructions (Signed)
[email or web address] Far right tab : resouces Self care video Leg bandaging: look at the bottom one Self mld for leg also

## 2021-02-27 NOTE — Therapy (Signed)
[facility] [address] [place], [place], [address] Phone: [phone]   Fax:  [phone]  Physical Therapy Treatment  Patient Details  Name: Tammie Jones MRN: [id number] Date of Birth: Nov 07, 1975 Referring Provider (PT): Christen Butter   Encounter Date: 02/27/2021   PT End of Session - 02/27/21 0937     Visit Number 4    Number of Visits 8    Date for PT Re-Evaluation 03/04/21    PT Start Time 0800    PT Stop Time 0920    PT Time Calculation (min) 80 min    Activity Tolerance Patient tolerated treatment well    Behavior During Therapy [facility] for tasks assessed/performed             Past Medical History:  Diagnosis Date   Back pain     Past Surgical History:  Procedure Laterality Date   LUMBAR DISC SURGERY     x2, L4-L5   PATELLAR TENDON REPAIR Bilateral    TOTAL ABDOMINAL HYSTERECTOMY  10/2018    There were no vitals filed for this visit.   Subjective Assessment - 02/27/21 0814     Subjective Pt states she went to see her oncologist last week at [facility] and everything is good.  She is considering going to see the lymphedema therapists at [facility] for another opinion about her lymphedema treatment.  She says she had beeen wearing her below kne athletic compression stockings ( below knee ) and feels like her legs are a little more swollen today for some reason    Pertinent History ovarian cancer in 2020 with full robotic full hysterectomy and chemotherap.  She has bilateral patellar tendon repairs and back surgery in 1998 with foot drop and numbness in her left side of her leg since thn    Currently in Pain? No/denies                   LYMPHEDEMA/ONCOLOGY QUESTIONNAIRE - 02/27/21 0001       Right Lower Extremity Lymphedema   20 cm Proximal to Suprapatella 65 cm    10 cm Proximal to Suprapatella 56 cm    At Midpatella/Popliteal Crease 41 cm    30 cm Proximal to Floor at Lateral Plantar Foot 36.5 cm    20  cm Proximal to Floor at Lateral Plantar Foot 29 1    10  cm Proximal to Floor at Lateral Malleoli 24 cm    5 cm Proximal to 1st MTP Joint 23 cm    Around Proximal Great Toe 7.5 cm      Left Lower Extremity Lymphedema   20 cm Proximal to Suprapatella 63 cm    10 cm Proximal to Suprapatella 53.3 cm    At Midpatella/Popliteal Crease 42 cm    30 cm Proximal to Floor at Lateral Plantar Foot 35 cm    20 cm Proximal to Floor at Lateral Plantar Foot 25.8 cm    10 cm Proximal to Floor at Lateral Malleoli 21.5 cm    5 cm Proximal to 1st MTP Joint 22.5 cm    Around Proximal Great Toe 7.5 cm             L-DEX FLOWSHEETS - 02/27/21 0800       L-DEX LYMPHEDEMA SCREENING   Measurement Type Bilateral    L-DEX MEASUREMENT EXTREMITY Lower Extremity    POSITION  Standing    DOMINANT SIDE Right    At Risk Side --   both  BASELINE RIGHT -2.4    BASELINE LEFT -6.5                       [facility] Adult PT Treatment/Exercise - 02/27/21 0001       Manual Therapy   Manual Therapy Edema management;Manual Lymphatic Drainage (MLD);Compression Bandaging    Manual therapy comments remeasured both legs. Pt has had reduction in right leg ( after wearing compression socks consistently and doing self MLD, but she has had significant increases in right thigh    Edema Management performed SOZO screen. Gave pt insurance information report about garments. returned email to Francee Piccolo that pt would like to get Flexi demo    Manual Lymphatic Drainage (MLD) In supine: Short neck, 5 diaphragmatic breaths, Rt axillary nodes, Rt inguino-axillary anastomosis, then Rt LE working from proximal to distal then retracing all steps    Compression Bandaging to right leg only and instructing patinet as I went:medium tg soft to lower leg, large tg soft to upper leg elastomull to great and first toe, rosidol soft to entire leg, 1-8, 2-10, 2-12 cm short stretch bandages from toes to groin. Pt instructed to bring all bandages  back next visit                    PT Education - 02/27/21 0934     Education Details reviewed self MLD and compression bandaging with weblinks on [email or web address]    Methods Explanation;Handout;Demonstration    Comprehension Verbalized understanding                 PT Long Term Goals - 01/02/21 1247       PT LONG TERM GOAL #1   Title Pt will be independent in managing lymphedem in her legs with self MLD and compression bandaging or use of compression garments.    Time 8    Period Weeks    Status New      PT LONG TERM GOAL #2   Title Pt will have decrease in right calf at 30 cm proximal ot the floor at lateral foot by 1 cm    Time 8    Period Weeks    Status New                   Plan - 02/27/21 1610     Clinical Impression Statement Pt is here to for MLD and compression bandaging. SOZO measurements also taken for future comparisons.  Upon leg measurement, she had reduction in right lower leg, but increase in right upper thish, left leg remained fairly stable. Noticed decrease in right thigh congestion with MLD today. Pt attententive to compression bandaging and will try to do at home. She will at least wear tg soft on her thigh. She is looking forward to [employer] and Comorbidities Comorbidity 3+    Comorbidities ovarian cancer with surgery and chemo,, bilateral patellar tendon repair, back surgery with foot drop    Stability/Clinical Decision Making Stable/Uncomplicated    Rehab Potential Good    PT Frequency 2x / week    PT Duration 8 weeks    PT Treatment/Interventions ADLs/Self Care Home Management;Orthotic Fit/Training;Patient/family education;Manual lymph drainage;Compression bandaging;Taping    PT Next Visit Plan How did bandaging go? Flexi demo?  continue  instruct in compression bandaging to Rt thigh with preparation to measure for garments on 03/13/2021    PT Home Exercise Plan Self MLD daily    Consulted  and Agree with  Plan of Care Patient             Patient will benefit from skilled therapeutic intervention in order to improve the following deficits and impairments:  Increased edema, Decreased knowledge of precautions, Decreased knowledge of use of DME  Visit Diagnosis: Lymphedema, not elsewhere classified     Problem List Patient Active Problem List   Diagnosis Date Noted   Bilateral lower extremity edema 12/15/2020   Numbness and tingling of both legs below knees 12/15/2020   Right knee pain 06/30/2020   Golfer's elbow, right 06/30/2020   Pain of left upper extremity 03/18/2020   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 Manson Passey PT  Donnetta Hail 02/27/2021, 9:42 AM  [facility] [address] [place], [place], [address] Phone: [phone]   Fax:  [phone]  Name: Darbi Chandran MRN: [id number] Date of Birth: 1975-10-29

## 2021-03-03 ENCOUNTER — Other Ambulatory Visit: Payer: Self-pay

## 2021-03-03 ENCOUNTER — Ambulatory Visit: Payer: BC Managed Care – PPO

## 2021-03-03 DIAGNOSIS — I890 Lymphedema, not elsewhere classified: Secondary | ICD-10-CM

## 2021-03-03 NOTE — Therapy (Signed)
[facility] [address] [place], [place], [address] Phone: [phone]   Fax:  [phone]  Physical Therapy Treatment  Patient Details  Name: Tammie Jones MRN: [id number] Date of Birth: 07/24/1976 Referring Provider (PT): Christen Butter   Encounter Date: 03/03/2021   PT End of Session - 03/03/21 1122     Visit Number 5    Number of Visits 8    Date for PT Re-Evaluation 03/04/21    PT Start Time 1002    PT Stop Time 1100    PT Time Calculation (min) 58 min    Activity Tolerance Patient tolerated treatment well    Behavior During Therapy [facility] for tasks assessed/performed             Past Medical History:  Diagnosis Date   Back pain     Past Surgical History:  Procedure Laterality Date   LUMBAR DISC SURGERY     x2, L4-L5   PATELLAR TENDON REPAIR Bilateral    TOTAL ABDOMINAL HYSTERECTOMY  10/2018    There were no vitals filed for this visit.   Subjective Assessment - 03/03/21 1003     Subjective  Bandage started sliding down and  I Ttook it off on Saturday.  It was making her leg hurt. Will have flexi touch demo tomorrow. The numbness she had been getting in her feet before therapy started has resolved now.   Pertinent History ovarian cancer in 2020 with full robotic full hysterectomy and chemotherap.  She has bilateral patellar tendon repairs and back surgery in 1998 with foot drop and numbness in her left side of her leg since thn    Patient Stated Goals for the swelling to go away and for her legs to feel better especially at night when she is sleeping.    Currently in Pain? No/denies                               [facility] Adult PT Treatment/Exercise - 03/03/21 0001       Manual Therapy   Manual Lymphatic Drainage (MLD) In supine: Short neck, 5 diaphragmatic breaths, Rt axillary nodes, Rt inguino-axillary anastomosis, then Rt LE working from proximal to distal then retracing all steps     Compression Bandaging to right leg only and instructing patient as I went:medium tg soft to lower leg, large tg soft to upper leg elastomull to great and first toe, rosidol soft to entire leg, 1-8 to foot with figure 8,  10 with heel lock ,1-10 to knee, 2-12 cm short stretch bandages knee to thigh and 1-12 cm from ankle to groin. Pt instructed to bring all bandages back next visit                         PT Long Term Goals - 01/02/21 1247       PT LONG TERM GOAL #1   Title Pt will be independent in managing lymphedem in her legs with self MLD and compression bandaging or use of compression garments.    Time 8    Period Weeks    Status New      PT LONG TERM GOAL #2   Title Pt will have decrease in right calf at 30 cm proximal ot the floor at lateral foot by 1 cm    Time 8    Period Weeks    Status New  Plan - 03/03/21 1210     Clinical Impression Statement Pt reported wrap slid down quickly after being here last and she removed the next day around noon.  She did not rewrap, but did try doing the MLD and felt she did well.  Continued MLD and compression bandaging.  Added an additional 12 cm wrap to increase pressure. Also discussed compression shorts to help keep wrap up. Discussed pt rewrapping at home if this wrap gets loose to get improved reduction.  Discussed lifetime nature of lymphedema and its management.  She is suppose to have flexitouch demo tomorrow in her home.    Personal Factors and Comorbidities Comorbidity 3+    Comorbidities ovarian cancer with surgery and chemo,, bilateral patellar tendon repair, back surgery with foot drop    Stability/Clinical Decision Making Stable/Uncomplicated    Rehab Potential Good    PT Frequency 2x / week    PT Duration 8 weeks    PT Treatment/Interventions ADLs/Self Care Home Management;Orthotic Fit/Training;Patient/family education;Manual lymph drainage;Compression bandaging;Taping    PT Next Visit  Plan How did bandaging go? Flexi demo?  continue  instruct in compression bandaging to Rt thigh with preparation to measure for garments on 03/13/2021    PT Home Exercise Plan Self MLD daily, try wrapping    Consulted and Agree with Plan of Care Patient             Patient will benefit from skilled therapeutic intervention in order to improve the following deficits and impairments:  Increased edema, Decreased knowledge of precautions, Decreased knowledge of use of DME  Visit Diagnosis: Lymphedema, not elsewhere classified     Problem List Patient Active Problem List   Diagnosis Date Noted   Bilateral lower extremity edema 12/15/2020   Numbness and tingling of both legs below knees 12/15/2020   Right knee pain 06/30/2020   Golfer's elbow, right 06/30/2020   Pain of left upper extremity 03/18/2020   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 [address] [place], [place], [address] Phone: [phone]   Fax:  [phone]  Name: Tammie Jones MRN: [id number] Date of Birth: [birth year] Alvira Monday, PT 03/03/21 12:20 PM

## 2021-03-11 ENCOUNTER — Other Ambulatory Visit: Payer: Self-pay

## 2021-03-11 ENCOUNTER — Encounter: Payer: Self-pay | Admitting: Physical Therapy

## 2021-03-11 ENCOUNTER — Ambulatory Visit: Payer: BC Managed Care – PPO | Attending: Medical-Surgical | Admitting: Physical Therapy

## 2021-03-11 DIAGNOSIS — R293 Abnormal posture: Secondary | ICD-10-CM | POA: Diagnosis present

## 2021-03-11 DIAGNOSIS — I890 Lymphedema, not elsewhere classified: Secondary | ICD-10-CM | POA: Insufficient documentation

## 2021-03-11 NOTE — Therapy (Signed)
[facility] [address] [place], [place], [address] Phone: [phone]   Fax:  [phone]  Physical Therapy Treatment  Patient Details  Name: Tammie Jones MRN: [id number] Date of Birth: 07-26-1976 Referring Provider (PT): Christen Butter   Encounter Date: 03/11/2021   PT End of Session - 03/11/21 1317     Visit Number 6    Number of Visits 12   pt likely will not need all visits and will stretch them out to monitor how she is managing at home   Date for PT Re-Evaluation 05/11/21    PT Start Time 1200    PT Stop Time 1305    PT Time Calculation (min) 65 min    Activity Tolerance Patient tolerated treatment well    Behavior During Therapy [facility] for tasks assessed/performed             Past Medical History:  Diagnosis Date   Back pain     Past Surgical History:  Procedure Laterality Date   LUMBAR DISC SURGERY     x2, L4-L5   PATELLAR TENDON REPAIR Bilateral    TOTAL ABDOMINAL HYSTERECTOMY  10/2018    There were no vitals filed for this visit.   Subjective Assessment - 03/11/21 1218     Subjective Pt got her Flexitouch trial and it went well. It should be delivered next week. She plans for her stockings on Friday and is thinking about getting 2 flat knit thigh highs with compression shorts.  Pt states she has been bandaging her legs every day and is wearing the tg soft at night    Pertinent History ovarian cancer in 2020 with full robotic full hysterectomy and chemotherap.  She has bilateral patellar tendon repairs and back surgery in 1998 with foot drop and numbness in her left side of her leg since thn    Patient Stated Goals for the swelling to go away and for her legs to feel better especially at night when she is sleeping.    Currently in Pain? No/denies                [facility] PT Assessment - 03/11/21 0001       Assessment   Medical Diagnosis ovarian cancer    Referring Provider (PT) Joy Jessup    Onset  Date/Surgical Date 11/06/18      Prior Function   Level of Independence Independent                           [facility] Adult PT Treatment/Exercise - 03/11/21 0001       Manual Therapy   Manual Therapy Edema management;Manual Lymphatic Drainage (MLD);Compression Bandaging    Edema Management provided bandages for left leg and instructed patient to bandage the left leg also on Thursday night so that when she comes in to get measured on Friday both legs will be reduced.    Manual Lymphatic Drainage (MLD) In supine: Short neck, 5 diaphragmatic breaths, Rt axillary nodes, Rt inguino-axillary anastomosis, then Rt LE working from proximal to distal then retracing all steps    Compression Bandaging to right leg only and instructing patinet as I went:medium tg soft to lower leg, large tg soft to upper leg elastomull to great and first toe, rosidol soft to entire leg, 1-8 to foot with figure 8,  10 with heel lock 1-10 to knee, 2-12 cm short stretch bandages knee to thigh and 1-12 cm from ankle to  groin. Pt instructed to bring all bandages back next visit Watched pt bandage and provided cues as needed. to make sure the back of the knee was well covered and she had spiral directing up all the way to the groin                         PT Long Term Goals - 03/11/21 1324       PT LONG TERM GOAL #1   Title Pt will be independent in managing lymphedem in her legs with self MLD and compression bandaging or use of compression garments.    Time 8    Period Weeks    Status On-going      PT LONG TERM GOAL #2   Title Pt will have decrease in right calf at 30 cm proximal ot the floor at lateral foot by 1 cm    Time 8    Period Weeks    Status On-going                   Plan - 03/11/21 1319     Clinical Impression Statement Pt had good reduction with intital trial of Flexitouch and will get it to help her manage her lymphedem on her own at home She reports she is doing  well with self bandaging and watched her do it today with minimal cuing. Discussed daytime garment options that she will be meaured for on Friday and will likely get a 30-40 thigh high for each leg and compression shorts to help keep the thigh high up and provide compression to upper thighs . Showed pt option for nighttime gaments but she did not comment on those as she may want to keep bandaging depending on cost of garment. Recert sent today for more visits for 2 months though attendance will likely be sporadic and only to check on pt to make sure she is following up at home since she will have the Flexitouch, daytime garments, nighttime garments/ or bandaging and will continue to exercise    Personal Factors and Comorbidities Comorbidity 3+    Comorbidities ovarian cancer with surgery and chemo,, bilateral patellar tendon repair, back surgery with foot drop    Stability/Clinical Decision Making Stable/Uncomplicated    Rehab Potential Good    PT Frequency 2x / week    PT Duration 8 weeks    PT Treatment/Interventions ADLs/Self Care Home Management;Orthotic Fit/Training;Patient/family education;Manual lymph drainage;Compression bandaging;Taping    PT Next Visit Plan Remeasure both legs after having been bandaged all night . perform MLD  with preparation to measure for garments on 03/13/2021    Consulted and Agree with Plan of Care Patient             Patient will benefit from skilled therapeutic intervention in order to improve the following deficits and impairments:  Increased edema, Decreased knowledge of precautions, Decreased knowledge of use of DME  Visit Diagnosis: Lymphedema, not elsewhere classified - Plan: PT plan of care cert/re-cert  Abnormal posture - Plan: PT plan of care cert/re-cert     Problem List Patient Active Problem List   Diagnosis Date Noted   Bilateral lower extremity edema 12/15/2020   Numbness and tingling of both legs below knees 12/15/2020   Right knee pain  06/30/2020   Golfer's elbow, right 06/30/2020   Pain of left upper extremity 03/18/2020   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 Manson Passey PT  Donnetta Hail 03/11/2021, 1:34 PM  [facility] [address] [place], [place], [address] Phone: [phone]   Fax:  [phone]  Name: Tammie Jones MRN: [id number] Date of Birth: 1975-12-04

## 2021-03-13 ENCOUNTER — Ambulatory Visit: Payer: BC Managed Care – PPO | Admitting: Physical Therapy

## 2021-03-13 ENCOUNTER — Ambulatory Visit: Payer: BC Managed Care – PPO | Admitting: Rehabilitation

## 2021-03-13 ENCOUNTER — Other Ambulatory Visit: Payer: Self-pay

## 2021-03-13 DIAGNOSIS — I890 Lymphedema, not elsewhere classified: Secondary | ICD-10-CM

## 2021-03-13 DIAGNOSIS — R293 Abnormal posture: Secondary | ICD-10-CM

## 2021-03-13 NOTE — Therapy (Addendum)
[facility] [address] [place], [place], [address] Phone: [phone]   Fax:  [phone]  Physical Therapy Treatment  Patient Details  Name: Tammie Jones MRN: [id number] Date of Birth: [birth year] Referring Provider (PT): Christen Butter   Encounter Date: 03/13/2021   PT End of Session - 03/13/21 0904     Visit Number 7    Number of Visits 12    Date for PT Re-Evaluation 05/11/21    PT Start Time 0800    PT Stop Time 0940    PT Time Calculation (min) 100 min    Activity Tolerance Patient tolerated treatment well    Behavior During Therapy [facility] for tasks assessed/performed             Past Medical History:  Diagnosis Date   Back pain     Past Surgical History:  Procedure Laterality Date   LUMBAR DISC SURGERY     x2, L4-L5   PATELLAR TENDON REPAIR Bilateral    TOTAL ABDOMINAL HYSTERECTOMY  10/2018    There were no vitals filed for this visit.   Subjective Assessment - 03/13/21 0811     Subjective Pt comes in with both legs bandaged.  She says she was able to do it ok and the only difficulty was reading the second leg once the first leg was bandaged but she was able to figure out how to do it. She is ready to get measured for thigh highs and compression shorts today    Pertinent History ovarian cancer in 2020 with full robotic full hysterectomy and chemotherap.  She has bilateral patellar tendon repairs and back surgery in 1998 with foot drop and numbness in her left side of her leg since thn    Patient Stated Goals for the swelling to go away and for her legs to feel better especially at night when she is sleeping.    Currently in Pain? No/denies                   LYMPHEDEMA/ONCOLOGY QUESTIONNAIRE - 03/13/21 0001       Right Lower Extremity Lymphedema   20 cm Proximal to Suprapatella 64.5 cm    10 cm Proximal to Suprapatella 55 cm    At Midpatella/Popliteal Crease 40 cm    30 cm Proximal to Floor at  Lateral Plantar Foot 35.4 cm    20 cm Proximal to Floor at Lateral Plantar Foot 28 1    10  cm Proximal to Floor at Lateral Malleoli 24 cm    5 cm Proximal to 1st MTP Joint 23 cm    Around Proximal Great Toe 7.8 cm      Left Lower Extremity Lymphedema   20 cm Proximal to Suprapatella 64 cm    10 cm Proximal to Suprapatella 52 cm    At Midpatella/Popliteal Crease 40 cm    30 cm Proximal to Floor at Lateral Plantar Foot 34 cm    20 cm Proximal to Floor at Lateral Plantar Foot 25.5 cm    10 cm Proximal to Floor at Lateral Malleoli 22 cm    5 cm Proximal to 1st MTP Joint 23 cm    Around Proximal Great Toe 7.6 cm                        [facility] Adult PT Treatment/Exercise - 03/13/21 0001       Manual Therapy   Manual Therapy Edema management;Manual Lymphatic Drainage (MLD)  Manual therapy comments remeasured both legs. pt with continued reduction in right leg. some reduction in left leg with bandaging,but had fullness in upper thigh Pt continues to have difficulty keeping bandages up on thighs, suggested she may try [employer] waist trainer wrapped around thigh to see if that will help    Manual Lymphatic Drainage (MLD) In supine: Short neck, 5 diaphragmatic breaths, Rt axillary nodes, Rt inguino-axillary anastomosis, then Rt LE working from medial to lateal proximal thigh to knee to focus on upper thigh swelling and same on other leg.      Prosthetics   Prosthetic Care Comments  KT: pt was measured today for medi cozy 450 thigh high stockings with sigvaris compreflex compression shorts. ordered was faxed to [facility].  pt was made aware that she should call for a fit check after receiving the garments and to let me know if the price ends up being too much after getting a quote and we can look at RTW options not covered by her insurance. x87min              Pt will require custom thigh high stockings due to length of the leg.  Pt is very tall and does not fit in a RTW flat knit  exostrong garment.  She also would benefit from 30-82mmHG compared to 20-30 which the RTW provides.             PT Long Term Goals - 03/13/21 0911       PT LONG TERM GOAL #1   Title Pt will be independent in managing lymphedem in her legs with self MLD and compression bandaging or use of compression garments.    Baseline 03/13/2021: pt can do self MLD and compression bandaging, waiting on garments    Period Weeks    Status On-going      PT LONG TERM GOAL #2   Title Pt will have decrease in right calf at 30 cm proximal ot the floor at lateral foot by 1 cm    Baseline baseline 40 cm at eval., 35.4 on 03/13/2021    Status Achieved                   Plan - 03/13/21 0904     Clinical Impression Statement Pt is able to perfom self MLD, compression bandaging to both legs foot to thigh and exercise.  She will receive her Flexitouch next week and has been measured for compression daytime garments. She will hold off nighttime garments for now. She would like to suspend PT treatments for now and continue to manage on her own. She will call for another appointment when garments arrive to check those before she is discharged from this episode.    Comorbidities ovarian cancer with surgery and chemo,, bilateral patellar tendon repair, back surgery with foot drop    PT Next Visit Plan assess for garment fit Discharge when ready             Patient will benefit from skilled therapeutic intervention in order to improve the following deficits and impairments:     Visit Diagnosis: Lymphedema, not elsewhere classified  Abnormal posture     Problem List Patient Active Problem List   Diagnosis Date Noted   Bilateral lower extremity edema 12/15/2020   Numbness and tingling of both legs below knees 12/15/2020   Right knee pain 06/30/2020   Golfer's elbow, right 06/30/2020   Pain of left upper extremity 03/18/2020   Carpal tunnel syndrome  of right wrist 03/18/2020   Numbness and  tingling in both hands 01/22/2020   Cervical radiculopathy 05/22/2019   Bacterial vaginosis 05/30/2018    Gwenevere Abbot, PT Ebony Hail, PT 03/13/2021, 12:20 PM  [facility] [address] [place], [place], [address] Phone: [phone]   Fax:  [phone]  Name: Tammie Jones MRN: [id number] Date of Birth: June 24, 1976

## 2021-03-18 ENCOUNTER — Encounter: Payer: BC Managed Care – PPO | Admitting: Physical Therapy

## 2021-03-20 ENCOUNTER — Encounter: Payer: BC Managed Care – PPO | Admitting: Physical Therapy

## 2021-03-21 IMAGING — MR MR CERVICAL SPINE W/O CM
5 series · 33 of 48 positions shown · non-contrast
Comparison: Plain film cervical spine 05/20/2019.

CLINICAL DATA: Neck and left shoulder pain radiating into the left
arm and fingers for 1 week. C7 radiculitis. History of ovarian
cancer.

EXAM:
MRI CERVICAL SPINE WITHOUT CONTRAST
TECHNIQUE: Multiplanar, multisequence MR imaging of the cervical spine was
performed. No intravenous contrast was administered.

[Series 5: T2 · sagittal · 3.0mm · 0.69mm/px · 6 of 13 slices shown (1 of 2)]
[im 1/13]
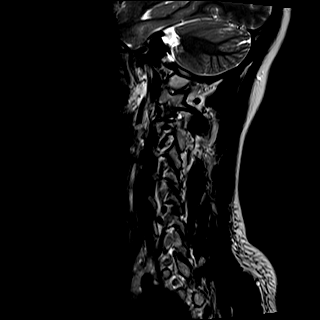
[im 3/13]
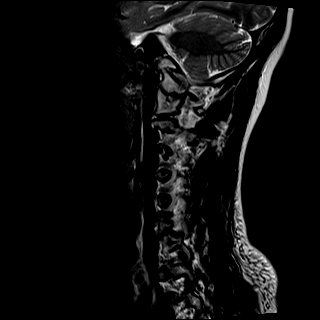
[im 5/13]
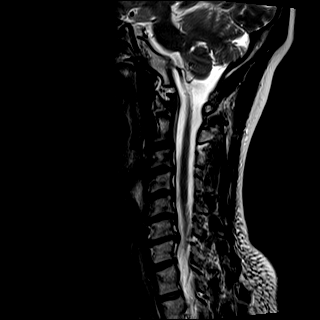
[im 8/13]
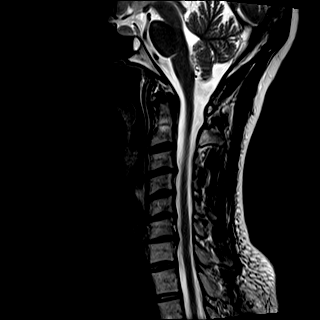
[im 10/13]
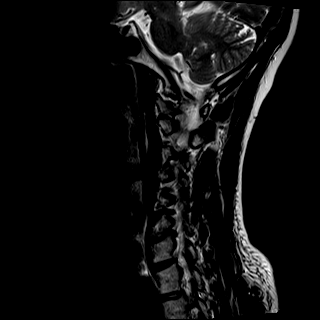
[im 13/13]
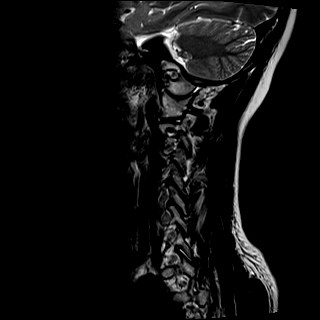

[Series 6: T1 · sagittal · 3.0mm · 0.86mm/px · 7 of 13 slices shown]
[im 1/13]
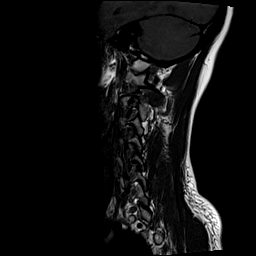
[im 3/13]
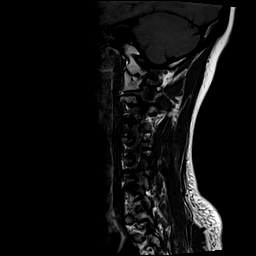
[im 5/13]
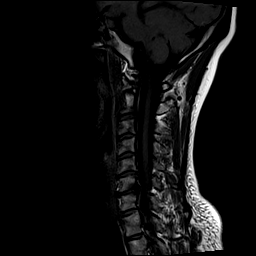
[im 7/13]
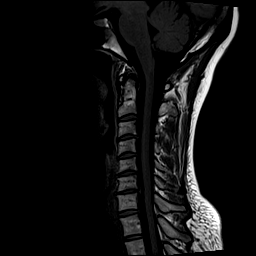
[im 9/13]
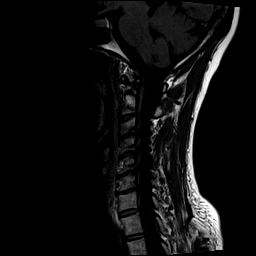
[im 11/13]
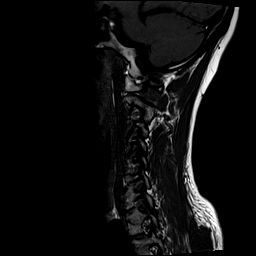
[im 13/13]
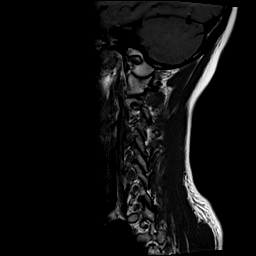

[Series 7: STIR · sagittal · 3.0mm · 0.43mm/px · 7 of 13 slices shown]
[im 1/13]
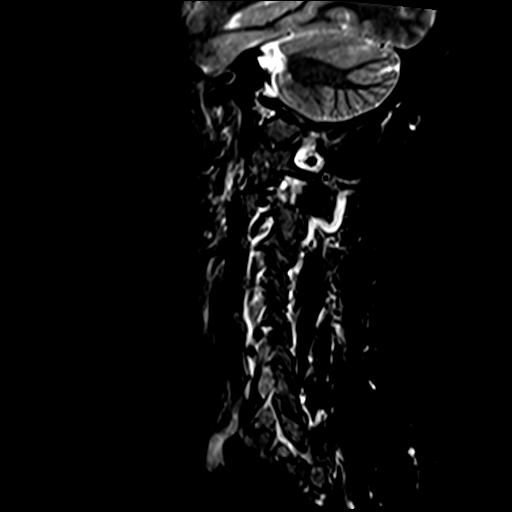
[im 3/13]
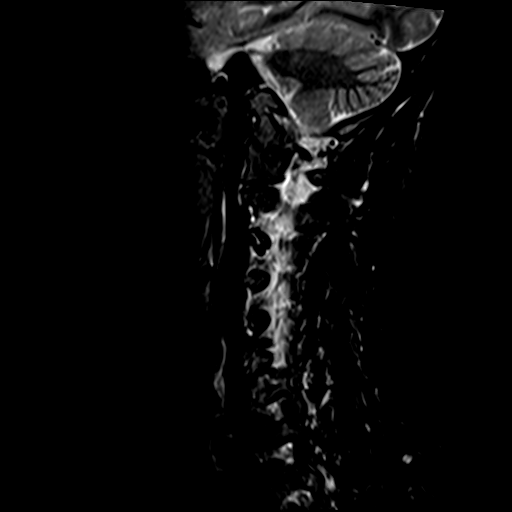
[im 5/13]
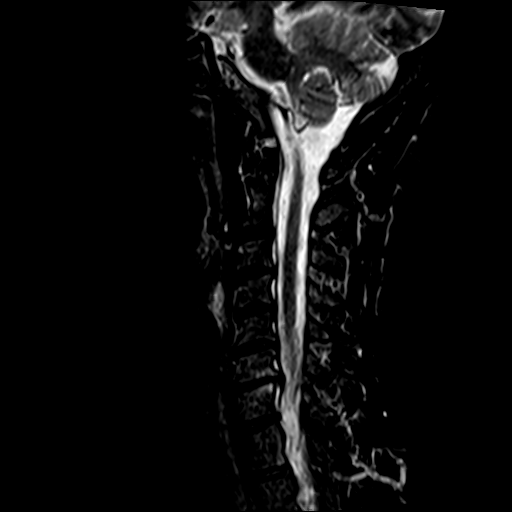
[im 7/13]
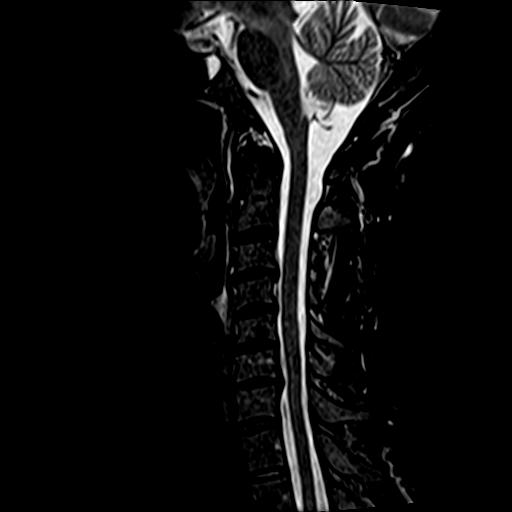
[im 9/13]
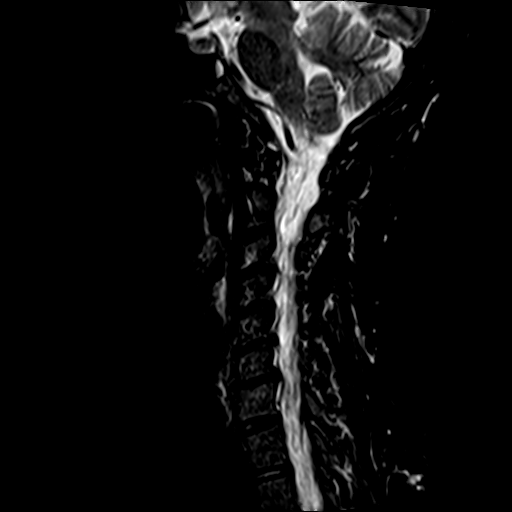
[im 11/13]
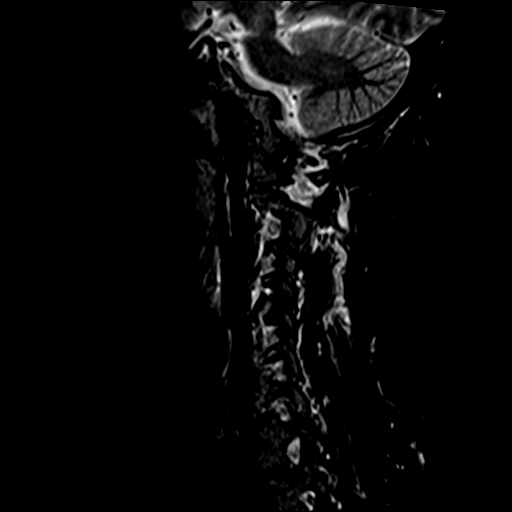
[im 13/13]
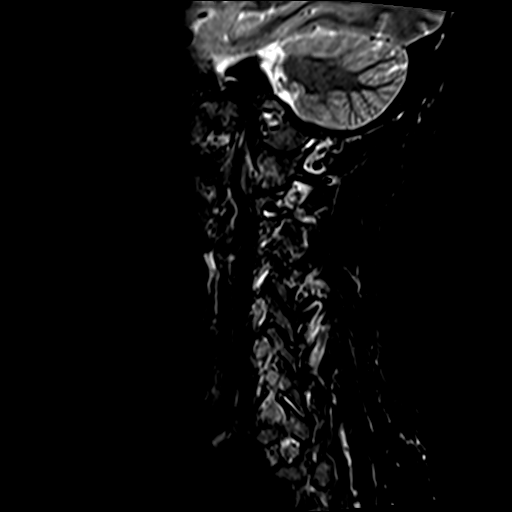

[Series 8: T2 · axial · 3.0mm · 0.62mm/px · z∈[-37,+65]mm · 8 of 28 slices shown (2 of 2)]
[im 1/28]
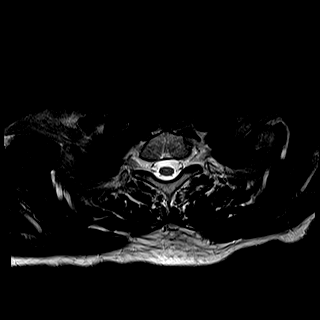
[im 5/28]
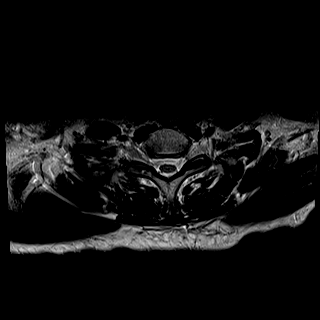
[im 9/28]
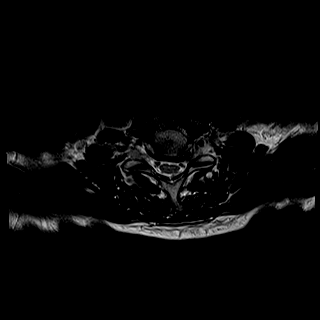
[im 13/28]
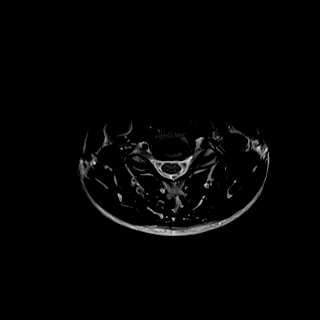
[im 15/28]
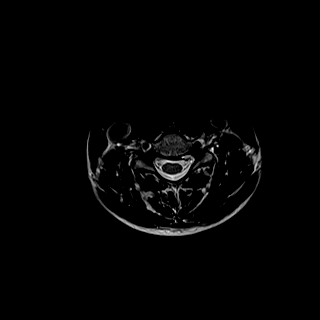
[im 19/28]
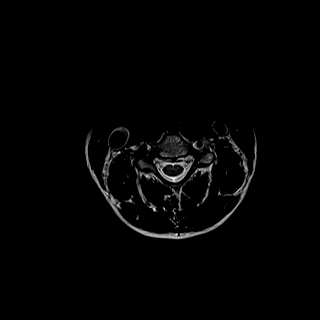
[im 23/28]
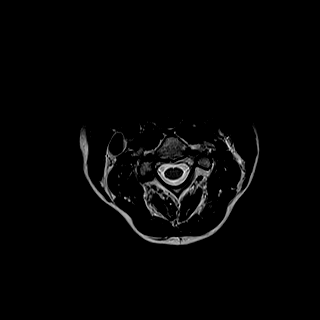
[im 28/28]
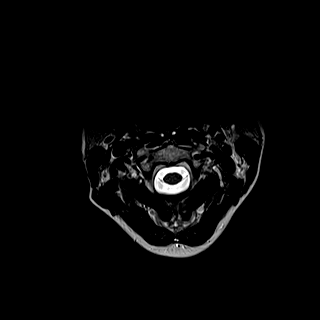

[Series 9: mpgr ax · axial · 3.0mm · 0.35mm/px · z∈[-30,+23]mm · 5 of 28 slices shown]
[im 1/28]
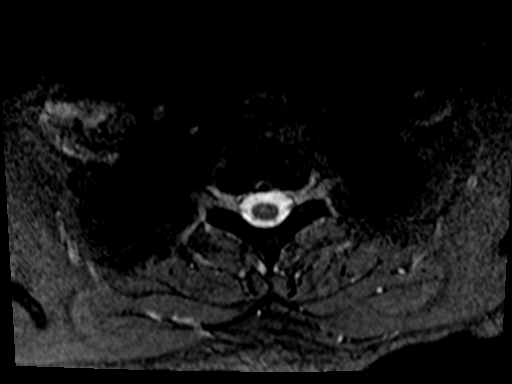
[im 5/28]
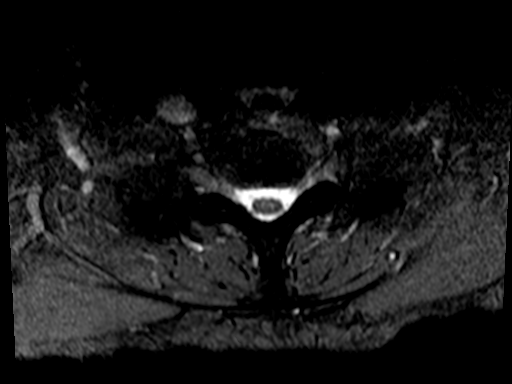
[im 9/28]
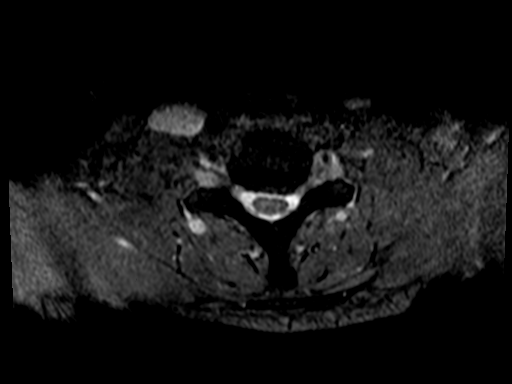
[im 13/28]
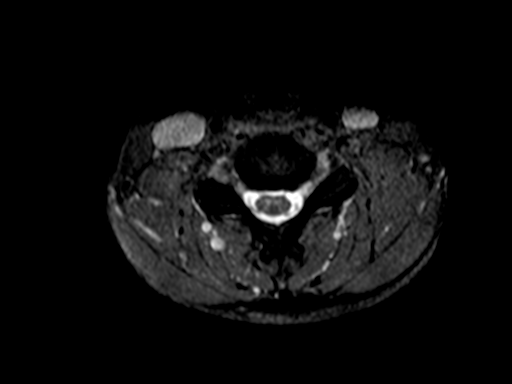
[im 15/28]
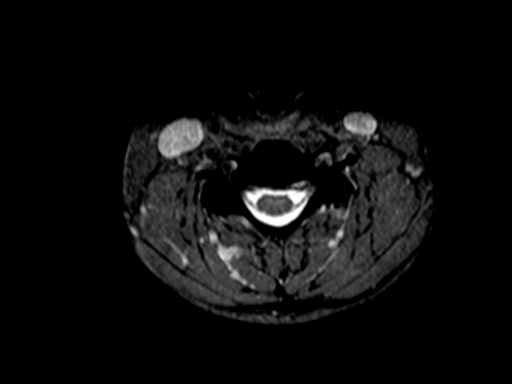

[33 of 48 positions shown; findings below may reference images not displayed]

FINDINGS: Alignment: Maintained with straightening of lordosis noted.

Vertebrae: No fracture, evidence of discitis, or bone lesion.

Cord: Normal signal throughout.

Posterior Fossa, vertebral arteries, paraspinal tissues: Negative.

Disc levels:

C2-3: Negative.

C3-4: Shallow disc bulge and endplate spur. There is some
uncovertebral disease. Mild right foraminal narrowing. The central
canal and left foramen are widely patent.

C4-5: Shallow central protrusion without central canal or foraminal
stenosis.

C5-6: Mild loss of disc space height with a shallow bulge, endplate
spur and uncovertebral disease. No stenosis.

C6-7: Shallow disc bulge and left worse than right uncovertebral
spurring. Moderately severe left foraminal narrowing could impact
the exiting left C7 root. The central canal and right foramen are
open.

C7-T1: Negative.
IMPRESSION: Uncovertebral spurring on the left at C6-7 causes moderately severe
foraminal narrowing and could impact the exiting left C7 root. The
central canal and right foramen are open at this level.

Mild right foraminal narrowing C3-4 due to uncovertebral disease.

Shallow central protrusion at C4-5 without stenosis.

## 2021-05-18 ENCOUNTER — Encounter: Payer: Self-pay | Admitting: Medical-Surgical

## 2021-07-10 ENCOUNTER — Ambulatory Visit: Payer: BC Managed Care – PPO | Admitting: Sports Medicine

## 2021-07-10 ENCOUNTER — Ambulatory Visit (INDEPENDENT_AMBULATORY_CARE_PROVIDER_SITE_OTHER): Payer: BC Managed Care – PPO

## 2021-07-10 ENCOUNTER — Other Ambulatory Visit: Payer: Self-pay

## 2021-07-10 DIAGNOSIS — M25561 Pain in right knee: Secondary | ICD-10-CM | POA: Diagnosis not present

## 2021-07-10 DIAGNOSIS — G8929 Other chronic pain: Secondary | ICD-10-CM

## 2021-07-10 DIAGNOSIS — M5412 Radiculopathy, cervical region: Secondary | ICD-10-CM | POA: Diagnosis not present

## 2021-07-10 DIAGNOSIS — Z09 Encounter for follow-up examination after completed treatment for conditions other than malignant neoplasm: Secondary | ICD-10-CM | POA: Diagnosis not present

## 2021-07-10 DIAGNOSIS — M51369 Other intervertebral disc degeneration, lumbar region without mention of lumbar back pain or lower extremity pain: Secondary | ICD-10-CM | POA: Insufficient documentation

## 2021-07-10 DIAGNOSIS — M5136 Other intervertebral disc degeneration, lumbar region: Secondary | ICD-10-CM | POA: Diagnosis not present

## 2021-07-10 MED ORDER — PREDNISONE 50 MG PO TABS: ORAL_TABLET | ORAL | 0 refills | Status: DC

## 2021-07-10 NOTE — Assessment & Plan Note (Signed)
This is a pleasant 45 year old female, she has a long history of knee pain, historically has had a patellar tendon rupture postop. More recently she has been having increasing pain, posterior/medial joint line, worse with twisting and terminal flexion. On exam her ligaments are stable, she has pain with flexion, positive McMurray's sign, no effusion, I do think she has a meniscal tear, adding x-rays, 5 days of prednisone, reaction knee brace, conditioning, return to see me in 6 weeks, injection if no better.

## 2021-07-10 NOTE — Progress Notes (Signed)
    Procedures performed today:    None.  Independent interpretation of notes and tests performed by another provider:   None.  Brief History, Exam, Impression, and Recommendations:    Right knee pain This is a pleasant 45 year old female, she has a long history of knee pain, historically has had a patellar tendon rupture postop. More recently she has been having increasing pain, posterior/medial joint line, worse with twisting and terminal flexion. On exam her ligaments are stable, she has pain with flexion, positive McMurray's sign, no effusion, I do think she has a meniscal tear, adding x-rays, 5 days of prednisone, reaction knee brace, conditioning, return to see me in 6 weeks, injection if no better.  Cervical radiculopathy Historically has done really well with cervical epidurals, this was last treated about a year and a half ago, she had left C7 distribution radicular symptoms, improved with a C6-C7 epidural, occasional gabapentin and tramadol. Having increasing paresthesias in the left hand, minimal neck pain, prednisone as below, C-spine conditioning, return to see me in 6 weeks, additional epidural if not better.  DDD (degenerative disc disease), lumbar Also having axial low back pain, worse with sitting, flexion, Valsalva, there was some radiation down the right leg. Known cervical DDD, same expected in the lumbar spine, adding x-rays, home conditioning exercises, prednisone as below. Return to see me in 6 weeks, MRI for interventional planning if no better.    ___________________________________________ Ihor Austin. Benjamin Stain, M.D., ABFM., CAQSM. Primary Care and Sports Medicine [facility]  Adjunct Instructor of Family Medicine  [employer]

## 2021-07-10 NOTE — Assessment & Plan Note (Signed)
Historically has done really well with cervical epidurals, this was last treated about a year and a half ago, she had left C7 distribution radicular symptoms, improved with a C6-C7 epidural, occasional gabapentin and tramadol. Having increasing paresthesias in the left hand, minimal neck pain, prednisone as below, C-spine conditioning, return to see me in 6 weeks, additional epidural if not better.

## 2021-07-10 NOTE — Assessment & Plan Note (Signed)
Also having axial low back pain, worse with sitting, flexion, Valsalva, there was some radiation down the right leg. Known cervical DDD, same expected in the lumbar spine, adding x-rays, home conditioning exercises, prednisone as below. Return to see me in 6 weeks, MRI for interventional planning if no better.

## 2021-07-14 ENCOUNTER — Other Ambulatory Visit: Payer: Self-pay | Admitting: Sports Medicine

## 2021-07-14 DIAGNOSIS — M25561 Pain in right knee: Secondary | ICD-10-CM

## 2021-07-14 MED ORDER — MELOXICAM 15 MG PO TABS: ORAL_TABLET | ORAL | 3 refills | Status: DC

## 2021-07-31 ENCOUNTER — Telehealth: Payer: Self-pay

## 2021-07-31 DIAGNOSIS — M5412 Radiculopathy, cervical region: Secondary | ICD-10-CM

## 2021-07-31 NOTE — Telephone Encounter (Signed)
Patient called to report that her numbness is getting worse and she would like to proceed with the injection. She would like to do this before the end of the year if possible due to insurance purposes.

## 2021-07-31 NOTE — Telephone Encounter (Signed)
Ordering the epidural, no guarantees about getting it done before the end of the year

## 2021-08-07 ENCOUNTER — Ambulatory Visit: Payer: BC Managed Care – PPO | Admitting: Sports Medicine

## 2021-08-13 ENCOUNTER — Ambulatory Visit
Admission: RE | Admit: 2021-08-13 | Discharge: 2021-08-13 | Disposition: A | Discharge status: Home / Self Care | Payer: BC Managed Care – PPO | Source: Ambulatory Visit | Attending: Sports Medicine | Admitting: Sports Medicine

## 2021-08-13 ENCOUNTER — Other Ambulatory Visit: Payer: Self-pay

## 2021-08-13 DIAGNOSIS — M5412 Radiculopathy, cervical region: Secondary | ICD-10-CM

## 2021-08-13 MED ORDER — TRIAMCINOLONE ACETONIDE 40 MG/ML IJ SUSP (RADIOLOGY)
60.0000 mg | Freq: Once | INTRAMUSCULAR | Status: AC
  Administered 2021-08-13: 60 mg via EPIDURAL

## 2021-08-13 MED ORDER — IOPAMIDOL (ISOVUE-M 300) INJECTION 61%
1.0000 mL | Freq: Once | INTRAMUSCULAR | Status: AC
  Administered 2021-08-13: 1 mL via EPIDURAL

## 2021-08-13 NOTE — Discharge Instructions (Signed)

## 2021-08-24 ENCOUNTER — Ambulatory Visit: Payer: BC Managed Care – PPO | Admitting: Sports Medicine

## 2021-09-23 ENCOUNTER — Encounter: Payer: Self-pay | Admitting: Family Medicine

## 2021-09-23 ENCOUNTER — Telehealth (INDEPENDENT_AMBULATORY_CARE_PROVIDER_SITE_OTHER): Payer: BC Managed Care – PPO | Admitting: Family Medicine

## 2021-09-23 VITALS — Ht 71.0 in | Wt 185.0 lb

## 2021-09-23 DIAGNOSIS — J019 Acute sinusitis, unspecified: Secondary | ICD-10-CM | POA: Diagnosis not present

## 2021-09-23 NOTE — Progress Notes (Signed)
° ° °  Virtual Visit via Video Note  I connected with Tammie Jones on 09/23/21 at 11:30 AM EST by a video enabled telemedicine application and verified that I am speaking with the correct person using two identifiers.   I discussed the limitations of evaluation and management by telemedicine and the availability of in person appointments. The patient expressed understanding and agreed to proceed.  Patient location: at home Provider location: in office  Subjective:    CC:   Chief Complaint  Patient presents with   Sore Throat    Headache, postnasal drainage, facial pressure/pain, 4 days. Covid test negative yesterday.     HPI: She complains of sore throat, headache and postnasal drip along with facial pressure and pain for about 4 days.  She tested negative for COVID yesterday. No fever. No cough.  Tender throat and some swollen LN.  + COVID exposure.  NO OTC medications.     Past medical history, Surgical history, Family history not pertinant except as noted below, Social history, Allergies, and medications have been entered into the medical record, reviewed, and corrections made.    Objective:    General: Speaking clearly in complete sentences without any shortness of breath.  Alert and oriented x3.  Normal judgment. No apparent acute distress.  Impression and Recommendations:    Problem List Items Addressed This Visit   None Visit Diagnoses     Acute non-recurrent sinusitis, unspecified location    -  Primary      Acute sinusitis - recommend symptomatic care. Call if not better after the weekend.  We discussed note for work if needed right now she wants to try to go to work and she has been masking.  Symptoms then please let us know.  No orders of the defined types were placed in this encounter.   No orders of the defined types were placed in this encounter.    I discussed the assessment and treatment plan with the patient. The patient was provided an opportunity to  ask questions and all were answered. The patient agreed with the plan and demonstrated an understanding of the instructions.   The patient was advised to call back or seek an in-person evaluation if the symptoms worsen or if the condition fails to improve as anticipated.  I spent 12 minutes on the day of the encounter to include pre-visit record review, face-to-face time with the patient and post visit ordering of test.   Tammie Gasser, MD

## 2021-10-01 ENCOUNTER — Ambulatory Visit: Payer: BC Managed Care – PPO | Admitting: Medical-Surgical

## 2021-10-01 ENCOUNTER — Encounter: Payer: Self-pay | Admitting: Medical-Surgical

## 2021-10-01 ENCOUNTER — Other Ambulatory Visit: Payer: Self-pay | Admitting: Medical-Surgical

## 2021-10-01 ENCOUNTER — Other Ambulatory Visit: Payer: Self-pay

## 2021-10-01 VITALS — BP 129/82 | HR 55 | Resp 20 | Ht 71.0 in | Wt 176.3 lb

## 2021-10-01 DIAGNOSIS — N898 Other specified noninflammatory disorders of vagina: Secondary | ICD-10-CM

## 2021-10-01 LAB — POCT URINALYSIS DIPSTICK
Bilirubin, UA: NEGATIVE
Blood, UA: NEGATIVE
Glucose, UA: NEGATIVE
Ketones, UA: NEGATIVE
Leukocytes, UA: NEGATIVE
Nitrite, UA: NEGATIVE
Protein, UA: POSITIVE — AB
Spec Grav, UA: >=1.03 — AB (ref 1.010–1.025)
Urobilinogen, UA: 0.2 E.U./dL
pH, UA: 5.5 (ref 5.0–8.0)

## 2021-10-01 NOTE — Progress Notes (Signed)
°  HPI with pertinent ROS:   CC: Possible BV  HPI: Pleasant 46 year old female presenting today for concerns of possible BV.  She has had this in the past and recently she has noticed a weird vaginal odor that smells "sour".  She has not had any vaginal discharge, itching, bleeding, or dysuria.  Is not currently sexually active and has not had any sexual encounters in at least 1 month.  Is not using any new sexual toys, materials, or cleansers.  No changes in cosmetics or cleansers.  Has not tried any treatment at home.  I reviewed the past medical history, family history, social history, surgical history, and allergies today and no changes were needed.  Please see the problem list section below in epic for further details.   Physical exam:   General: Well Developed, well nourished, and in no acute distress.  Neuro: Alert and oriented x3.  HEENT: Normocephalic, atraumatic.  Skin: Warm and dry. Cardiac: Regular rate and rhythm, no murmurs rubs or gallops, no lower extremity edema.  Respiratory: Clear to auscultation bilaterally. Not using accessory muscles, speaking in full sentences.  Impression and Recommendations:    1. Vaginal odor POCT UA negative for nitrites, leukocytes, ketones, and glucose.  Positive for elevated specific gravity and trace protein.  Sending for GC and chlamydia.  With stat wet prep today.  Holding off on treatment until results come in.  With elevated specific gravity, recommend staying well-hydrated. - POCT Urinalysis Dipstick - C. trachomatis/N. gonorrhoeae RNA - WET PREP FOR TRICH, YEAST, CLUE  Return if symptoms worsen or fail to improve. ___________________________________________ Thayer Ohm, DNP, APRN, FNP-BC Primary Care and Sports Medicine [facility]

## 2021-10-01 NOTE — Addendum Note (Signed)
Addended by: Beverlee Nims on: 10/01/2021 03:15 PM   Modules accepted: Orders

## 2021-10-02 ENCOUNTER — Encounter: Payer: Self-pay | Admitting: Medical-Surgical

## 2021-10-02 LAB — HOUSE ACCOUNT TRACKING

## 2021-10-02 LAB — C. TRACHOMATIS/N. GONORRHOEAE RNA
C. trachomatis RNA, TMA: NOT DETECTED
N. gonorrhoeae RNA, TMA: NOT DETECTED

## 2021-10-06 MED ORDER — METRONIDAZOLE 500 MG PO TABS: 500.0000 mg | ORAL_TABLET | Freq: Two times a day (BID) | ORAL | 0 refills | Status: AC

## 2022-02-03 ENCOUNTER — Other Ambulatory Visit: Payer: Self-pay

## 2022-02-03 DIAGNOSIS — M25561 Pain in right knee: Secondary | ICD-10-CM

## 2022-02-03 MED ORDER — MELOXICAM 15 MG PO TABS: ORAL_TABLET | ORAL | 3 refills | Status: DC

## 2022-02-08 ENCOUNTER — Encounter: Payer: Self-pay | Admitting: Family Medicine

## 2022-02-08 ENCOUNTER — Ambulatory Visit (INDEPENDENT_AMBULATORY_CARE_PROVIDER_SITE_OTHER): Payer: BC Managed Care – PPO | Admitting: Family Medicine

## 2022-02-08 VITALS — BP 134/88 | HR 48 | Wt 185.0 lb

## 2022-02-08 DIAGNOSIS — L290 Pruritus ani: Secondary | ICD-10-CM

## 2022-02-08 DIAGNOSIS — L293 Anogenital pruritus, unspecified: Secondary | ICD-10-CM | POA: Diagnosis not present

## 2022-02-08 NOTE — Progress Notes (Signed)
   Acute Office Visit  Subjective:     Patient ID: Tammie Jones, female    DOB: 01/18/1976, 46 y.o.   MRN: [id number]  Chief Complaint  Patient presents with   Anal Itching    X 3days    HPI Patient is in today for rectal burning and itching.  She says that she has been working out pretty regularly and wondered if there may be some irritation but she felt some rough bumps in the area so decided to come in today to get have it checked out.  It is very tender to wipe.  She denies any unusual odor or discharge.  She does have a history of hemorrhoids.  No fevers or chills.  ROS      Objective:    BP 134/88   Pulse (!) 48   Wt 185 lb (83.9 kg)   SpO2 98%   BMI 25.80 kg/m    Physical Exam Vitals reviewed.  Constitutional:      Appearance: She is well-developed.  HENT:     Head: Normocephalic and atraumatic.  Eyes:     Conjunctiva/sclera: Conjunctivae normal.  Cardiovascular:     Rate and Rhythm: Normal rate.  Pulmonary:     Effort: Pulmonary effort is normal.  Musculoskeletal:     Comments: Skin b/t the rectal and the anus appears thickened and rough and dry.    Skin:    General: Skin is dry.     Coloration: Skin is not pale.  Neurological:     Mental Status: She is alert and oriented to person, place, and time.  Psychiatric:        Behavior: Behavior normal.     No results found for any visits on 02/08/22.      Assessment & Plan:   Problem List Items Addressed This Visit   None Visit Diagnoses     Rectal itching    -  Primary   Relevant Orders   WET PREP FOR TRICH, YEAST, CLUE   Perineal itching, female          Perineal itching-the skin there just looks a little irritated and rough amiss more like chafing.  We discussed keeping the area moisturized with some type of barrier ointment or even Aquaphor twice a day.  Try to minimize irritation to that area.  We did do a wet prep to send off.no vesicles.  Consider skin infection as well. Will await  wetprep.    No orders of the defined types were placed in this encounter.   No follow-ups on file.  Nani Gasser, MD

## 2022-02-09 LAB — WET PREP FOR TRICH, YEAST, CLUE
MICRO NUMBER:: 13601089
Specimen Quality: ADEQUATE

## 2022-02-10 MED ORDER — TRIAMCINOLONE ACETONIDE 0.1 % EX CREA: 1.0000 | TOPICAL_CREAM | Freq: Two times a day (BID) | CUTANEOUS | 0 refills | Status: DC | PRN

## 2022-02-10 NOTE — Addendum Note (Signed)
Addended by: Nani Gasser D on: 02/10/2022 07:34 AM   Modules accepted: Orders

## 2022-02-10 NOTE — Progress Notes (Signed)
HI Tammie Jones,  I apologize in the delay in getting the results to you.  I had tried to login from home yesterday and kept getting a server error.  But there was no overgrowth of yeast or bacteria present which is good.  I really think this is just skin irritation and chafing.  I am going to send over a mild topical steroid to calm reduce inflammation continue to moisturize.  If you develop any new or worsening symptoms then we can get you in.

## 2022-04-21 ENCOUNTER — Ambulatory Visit: Payer: BC Managed Care – PPO | Admitting: Physician Assistant

## 2022-04-21 ENCOUNTER — Encounter: Payer: Self-pay | Admitting: Physician Assistant

## 2022-04-21 VITALS — BP 128/81 | HR 63

## 2022-04-21 DIAGNOSIS — R002 Palpitations: Secondary | ICD-10-CM | POA: Diagnosis not present

## 2022-04-21 DIAGNOSIS — R9431 Abnormal electrocardiogram [ECG] [EKG]: Secondary | ICD-10-CM

## 2022-04-21 NOTE — Progress Notes (Signed)
Acute Office Visit  Subjective:     Patient ID: Tammie Jones, female    DOB: 1975/09/17, 46 y.o.   MRN: [id number]  No chief complaint on file.   HPI Patient is in today for palpitations. Pt has no cardiac history or issues with palpitations. She denies any new medications. She has had a few episodes of heart "fluttering" but over the past 2 weeks it has happened daily if not twice a day. She does not drink alcohol or caffeine very often. She does exercise a lot and does not always drink a lot of water. No other known trigger. She denies any CP. Once she did feel a little dizzy but she was also outside in the heat. No swelling or shortness of breath.   .. 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     Objective:    BP 128/81   Pulse 63   SpO2 98%  BP Readings from Last 3 Encounters:  04/21/22 128/81  02/08/22 134/88  10/01/21 129/82   Wt Readings from Last 3 Encounters:  02/08/22 185 lb (83.9 kg)  10/01/21 176 lb 4.8 oz (80 kg)  09/23/21 185 lb (83.9 kg)      Physical Exam Constitutional:      Appearance: Normal appearance.  Cardiovascular:     Rate and Rhythm: Regular rhythm. Bradycardia present.     Pulses: Normal pulses.     Heart sounds: Normal heart sounds.   Pulmonary:     Effort: Pulmonary effort is normal.  Musculoskeletal:     Cervical back: Normal range of motion and neck supple.     Right lower leg: No edema.     Left lower leg: No edema.  Neurological:     General: No focal deficit present.     Mental Status: She is alert.  Psychiatric:        Mood and Affect: Mood normal.    EKG: Sinus bradycardia with flatten t-wave in all leads.        Assessment & Plan:  Marland KitchenMarland KitchenDiagnoses and all orders for this visit:  Palpitations -     CBC w/Diff/Platelet -     COMPLETE METABOLIC PANEL WITH GFR -     Magnesium -     TSH  Electrocardiogram showing T wave abnormalities -     CBC w/Diff/Platelet -     COMPLETE METABOLIC PANEL WITH GFR -     Magnesium -     TSH  Discussed PVC and palpitations with patient EKG sinus bradycardia but normal for patient with flatten twaves. No ischemia.  Concern for potassium abnormality with flatten twaves Will check TSH/CBC/CMP/Magnesium Vitals looks great today HO given Follow up with P[facility]P in 2 weeks if palpitations continue without trigger can  consider heart monitor   Tandy Gaw, PA-C

## 2022-04-21 NOTE — Patient Instructions (Signed)
Premature Ventricular Contraction  A premature ventricular contraction (PVC) is a common kind of irregular heartbeat (arrhythmia). These contractions are extra heartbeats that start in the ventricles of the heart and occur too early in the normal sequence. During the PVC, the heart's normal electrical pathway is not used, so the beat is shorter and less effective. In most cases, these contractions come and go and do not require treatment. What are the causes? Common causes of the condition include: Smoking. Drinking alcohol. Certain medicines. Some illegal drugs. Stress. Caffeine. Certain medical conditions can also cause PVCs: Heart failure. Heart attack, or coronary artery disease. Heart valve problems. Changes in minerals in the blood (electrolytes). Low blood oxygen levels or high carbon dioxide levels. In many cases, the cause of this condition is not known. What are the signs or symptoms? The main symptom of this condition is fast or skipped heartbeats (palpitations). Other symptoms include: Chest pain. Shortness of breath. Feeling tired. Dizziness. Difficulty exercising. In some cases, there are no symptoms. How is this diagnosed? This condition may be diagnosed based on: Your medical history. A physical exam. During the exam, the health care provider will check for irregular heartbeats. Tests, such as: An ECG (electrocardiogram) to monitor the electrical activity of your heart. An ambulatory cardiac monitor. This device records your heartbeats for 24 hours or more. Stress tests to see how exercise affects your heart rhythm and blood supply. An echocardiogram. This test uses sound waves (ultrasound) to produce an image of your heart. An electrophysiology study (EPS). This test checks for electrical problems in your heart. How is this treated? Treatment for this condition depends on any underlying conditions, the type of PVCs that you are having, and how much the symptoms  are interfering with your daily life. Possible treatments include: Avoiding things that cause premature contractions (triggers). These include caffeine and alcohol. Taking medicines if symptoms are severe or if the extra heartbeats are frequent. Getting treatment for underlying conditions that cause PVCs. Having an implantable cardioverter defibrillator (ICD), if you are at risk for a serious arrhythmia. The ICD is a small device that is inserted into your chest to monitor your heartbeat. When it senses an irregular heartbeat, it sends a shock to bring the heartbeat back to normal. Having a procedure to destroy the portion of the heart tissue that sends out abnormal signals (catheter ablation). In some cases, no treatment is required. Follow these instructions at home: Lifestyle Do not use any products that contain nicotine or tobacco, such as cigarettes, e-cigarettes, and chewing tobacco. If you need help quitting, ask your health care provider. Do not use illegal drugs. Exercise regularly. Ask your health care provider what type of exercise is safe for you. Try to get at least 7-9 hours of sleep each night, or as much as recommended by your health care provider. Find healthy ways to manage stress. Avoid stressful situations when possible. Alcohol use Do not drink alcohol if: Your health care provider tells you not to drink. You are pregnant, may be pregnant, or are planning to become pregnant. Alcohol triggers your episodes. If you drink alcohol: Limit how much you use to: 0-1 drink a day for women. 0-2 drinks a day for men. Be aware of how much alcohol is in your drink. In the [place] one drink equals one 12 oz bottle of beer (355 mL), one 5 oz glass of wine (148 mL), or one 1 oz glass of hard liquor (44 mL). General instructions Take over-the-counter and prescription   medicines only as told by your health care provider. If caffeine triggers episodes of PVC, do not eat, drink, or use  anything with caffeine in it. Keep all follow-up visits as told by your health care provider. This is important. Contact a health care provider if you: Feel palpitations. Get help right away if you: Have chest pain. Have shortness of breath. Have sweating for no reason. Have nausea and vomiting. Become light-headed or you faint. Summary A premature ventricular contraction (PVC) is a common kind of irregular heartbeat (arrhythmia). In most cases, these contractions come and go and do not require treatment. You may need to wear an ambulatory cardiac monitor. This records your heartbeats for 24 hours or more. Treatment depends on any underlying conditions, the type of PVCs that you are having, and how much the symptoms are interfering with your daily life. This information is not intended to replace advice given to you by your health care provider. Make sure you discuss any questions you have with your health care provider. Document Revised: 10/29/2021 Document Reviewed: 12/23/2020 Elsevier Patient Education  2023 Elsevier Inc.  

## 2022-04-22 LAB — COMPLETE METABOLIC PANEL WITH GFR
AG Ratio: 2 (calc) (ref 1.0–2.5)
ALT: 10 U/L (ref 6–29)
AST: 20 U/L (ref 10–35)
Albumin: 4.5 g/dL (ref 3.6–5.1)
Alkaline phosphatase (APISO): 53 U/L (ref 31–125)
BUN: 11 mg/dL (ref 7–25)
CO2: 30 mmol/L (ref 20–32)
Calcium: 10.5 mg/dL — ABNORMAL HIGH (ref 8.6–10.2)
Chloride: 104 mmol/L (ref 98–110)
Creat: 0.95 mg/dL (ref 0.50–0.99)
Globulin: 2.3 g/dL (calc) (ref 1.9–3.7)
Glucose, Bld: 69 mg/dL (ref 65–99)
Potassium: 4 mmol/L (ref 3.5–5.3)
Sodium: 140 mmol/L (ref 135–146)
Total Bilirubin: 0.6 mg/dL (ref 0.2–1.2)
Total Protein: 6.8 g/dL (ref 6.1–8.1)
eGFR: 74 mL/min/{1.73_m2} (ref >=60)

## 2022-04-22 LAB — CBC WITH DIFFERENTIAL/PLATELET
Absolute Monocytes: 299 cells/uL (ref 200–950)
Basophils Absolute: 29 cells/uL (ref 0–200)
Basophils Relative: 0.7 %
Eosinophils Absolute: 49 cells/uL (ref 15–500)
Eosinophils Relative: 1.2 %
HCT: 37.4 % (ref 35.0–45.0)
Hemoglobin: 12.5 g/dL (ref 11.7–15.5)
Lymphs Abs: 1398 cells/uL (ref 850–3900)
MCH: 29.4 pg (ref 27.0–33.0)
MCHC: 33.4 g/dL (ref 32.0–36.0)
MCV: 88 fL (ref 80.0–100.0)
MPV: 11.5 fL (ref 7.5–12.5)
Monocytes Relative: 7.3 %
Neutro Abs: 2325 cells/uL (ref 1500–7800)
Neutrophils Relative %: 56.7 %
Platelets: 191 10*3/uL (ref 140–400)
RBC: 4.25 10*6/uL (ref 3.80–5.10)
RDW: 12.3 % (ref 11.0–15.0)
Total Lymphocyte: 34.1 %
WBC: 4.1 10*3/uL (ref 3.8–10.8)

## 2022-04-22 LAB — TSH: TSH: 0.9 mIU/L

## 2022-04-22 LAB — MAGNESIUM: Magnesium: 2 mg/dL (ref 1.5–2.5)

## 2022-04-23 NOTE — Addendum Note (Signed)
Addended by: Chalmers Cater on: 04/23/2022 11:24 AM   Modules accepted: Orders

## 2022-04-23 NOTE — Progress Notes (Signed)
Shay,   Thyroid looks great.  Magnesium looks great.  Kidney and liver look good.  Calcium is just a bit out of normal range. Make sure you are taking vitamin D at least 1000 units daily.

## 2022-05-17 ENCOUNTER — Ambulatory Visit: Payer: BC Managed Care – PPO | Admitting: Medical-Surgical

## 2022-06-02 ENCOUNTER — Telehealth (INDEPENDENT_AMBULATORY_CARE_PROVIDER_SITE_OTHER): Payer: BC Managed Care – PPO | Admitting: Family Medicine

## 2022-06-02 ENCOUNTER — Encounter: Payer: Self-pay | Admitting: Family Medicine

## 2022-06-02 DIAGNOSIS — J0100 Acute maxillary sinusitis, unspecified: Secondary | ICD-10-CM | POA: Diagnosis not present

## 2022-06-02 MED ORDER — DOXYCYCLINE HYCLATE 100 MG PO TABS: 100.0000 mg | ORAL_TABLET | Freq: Two times a day (BID) | ORAL | 0 refills | Status: DC

## 2022-06-02 NOTE — Progress Notes (Signed)
Tammie Jones - 46 y.o. female MRN [id number]  Date of birth: [birth year]   All issues noted in this document were discussed and addressed.  No physical exam was performed (except for noted visual exam findings with Video Visits).  I discussed the limitations of evaluation and management by telemedicine and the availability of in person appointments. The patient expressed understanding and agreed to proceed.  I connected withNAME@ on 06/02/22 at  1:10 PM EDT by a video enabled telemedicine application and verified that I am speaking with the correct person using two identifiers.  Present at visit: Luetta Nutting, DO [facility]   Patient Location: [place] [id number]   Provider location:   [place]  No chief complaint on file.   HPI  Tammie Jones is a 46 y.o. female who presents via audio/video conferencing for a telehealth visit today. She has complaint of sinus pain with chest congestion.  This started about 5 days ago and has continued to worsen.  Mucus has become more thickened and yellow in color.  She is drinking plenty of fluids.  She has tried OTC medications for symptom relief but hasn't helped much.  She denies fever or chills.  Home COVID test negative.     ROS:  A comprehensive ROS was completed and negative except as noted per HPI  Past Medical History:  Diagnosis Date   Back pain     Past Surgical History:  Procedure Laterality Date   LUMBAR DISC SURGERY     x2, L4-L5   PATELLAR TENDON REPAIR Bilateral    TOTAL ABDOMINAL HYSTERECTOMY  10/2018    No family history on file.  Social History   Socioeconomic History   Marital status: Single    Spouse name: Not on file   Number of children: Not on file   Years of education: Not on file   Highest education level: Not on file  Occupational History   Not on file  Tobacco Use   Smoking status: Never   Smokeless tobacco: Never  Substance and Sexual Activity   Alcohol use: Yes     Alcohol/week: 2.0 - 3.0 standard drinks of alcohol    Types: 2 - 3 Standard drinks or equivalent per week   Drug use: Never   Sexual activity: Yes    Birth control/protection: Other-see comments    Comment: same sex partner  Other Topics Concern   Not on file  Social History Narrative   Not on file   Social Determinants of Health   Financial Resource Strain: Not on file  Food Insecurity: Not on file  Transportation Needs: Not on file  Physical Activity: Not on file  Stress: Not on file  Social Connections: Not on file  Intimate Partner Violence: Not on file     Current Outpatient Medications:    doxycycline (VIBRA-TABS) 100 MG tablet, Take 1 tablet (100 mg total) by mouth 2 (two) times daily., Disp: 20 tablet, Rfl: 0   meloxicam (MOBIC) 15 MG tablet, One tab PO qAM with a meal for 2 weeks, then daily prn pain., Disp: 30 tablet, Rfl: 3   neomycin-polymyxin b-dexamethasone (MAXITROL) 3.5-10000-0.1 OINT, SMARTSIG:sparingly In Eye(s) Every Night, Disp: , Rfl:    triamcinolone cream (KENALOG) 0.1 %, Apply 1 Application topically 2 (two) times daily as needed., Disp: 30 g, Rfl: 0  EXAM:  VITALS per patient if applicable: There were no vitals taken for this visit.  GENERAL: alert, oriented, appears well and in no acute  distress  HEENT: atraumatic, conjunttiva clear, no obvious abnormalities on inspection of external nose and ears  NECK: normal movements of the head and neck  LUNGS: on inspection no signs of respiratory distress, breathing rate appears normal, no obvious gross SOB, gasping or wheezing  CV: no obvious cyanosis  MS: moves all visible extremities without noticeable abnormality  PSYCH/NEURO: pleasant and cooperative, no obvious depression or anxiety, speech and thought processing grossly intact  ASSESSMENT AND PLAN:  Discussed the following assessment and plan:  Acute maxillary sinusitis Start doxycycline 100mg  bid x10 days.  Push fluids and may use OTC  medications for symptomatic and supportive care.  Will write out of work for the next 2 days for rest and recovery.  Contact clinic if having new/worsening symptoms.      I discussed the assessment and treatment plan with the patient. The patient was provided an opportunity to ask questions and all were answered. The patient agreed with the plan and demonstrated an understanding of the instructions.   The patient was advised to call back or seek an in-person evaluation if the symptoms worsen or if the condition fails to improve as anticipated.    , DO

## 2022-06-02 NOTE — Progress Notes (Signed)
Attempted to contact the patient. No answer. @ 1300

## 2022-06-02 NOTE — Assessment & Plan Note (Signed)
Start doxycycline 100mg  bid x10 days.  Push fluids and may use OTC medications for symptomatic and supportive care.  Will write out of work for the next 2 days for rest and recovery.  Contact clinic if having new/worsening symptoms.

## 2022-06-11 ENCOUNTER — Encounter: Payer: Self-pay | Admitting: Family Medicine

## 2022-07-08 ENCOUNTER — Ambulatory Visit: Payer: BC Managed Care – PPO | Admitting: Sports Medicine

## 2022-07-12 ENCOUNTER — Ambulatory Visit: Payer: BC Managed Care – PPO | Admitting: Sports Medicine

## 2022-07-12 DIAGNOSIS — M25561 Pain in right knee: Secondary | ICD-10-CM | POA: Diagnosis not present

## 2022-07-12 DIAGNOSIS — M5412 Radiculopathy, cervical region: Secondary | ICD-10-CM | POA: Diagnosis not present

## 2022-07-12 MED ORDER — MELOXICAM 15 MG PO TABS: ORAL_TABLET | ORAL | 3 refills | Status: DC

## 2022-07-12 NOTE — Progress Notes (Signed)
    Procedures performed today:    None.  Independent interpretation of notes and tests performed by another provider:   None.  Brief History, Exam, Impression, and Recommendations:    Cervical radiculopathy This is a very pleasant 46 year old female, she does really well with cervical epidurals, last epidural was in January 2023, now having recurrence of left C7 distribution radiculitis, ordering left C6-C7 interlaminar epidural, refilling meloxicam, continue gabapentin and tramadol as needed.  Chronic process with exacerbation and pharmacologic intervention  ____________________________________________ Ihor Austin. Benjamin Stain, M.D., ABFM., CAQSM., AME. Primary Care and Sports Medicine [facility]  Adjunct Professor of Family Medicine  [place] [employer] [facility] [employer]  Restaurant manager, fast food

## 2022-07-12 NOTE — Assessment & Plan Note (Signed)
This is a very pleasant 46 year old female, she does really well with cervical epidurals, last epidural was in January 2023, now having recurrence of left C7 distribution radiculitis, ordering left C6-C7 interlaminar epidural, refilling meloxicam, continue gabapentin and tramadol as needed.

## 2022-07-27 ENCOUNTER — Encounter: Payer: Self-pay | Admitting: Sports Medicine

## 2022-08-10 ENCOUNTER — Ambulatory Visit
Admission: RE | Admit: 2022-08-10 | Discharge: 2022-08-10 | Disposition: A | Discharge status: Home / Self Care | Payer: BC Managed Care – PPO | Source: Ambulatory Visit | Attending: Sports Medicine | Admitting: Sports Medicine

## 2022-08-10 DIAGNOSIS — M5412 Radiculopathy, cervical region: Secondary | ICD-10-CM

## 2022-08-10 MED ORDER — IOPAMIDOL (ISOVUE-M 300) INJECTION 61%
1.0000 mL | Freq: Once | INTRAMUSCULAR | Status: AC
  Administered 2022-08-10: 1 mL via EPIDURAL

## 2022-08-10 MED ORDER — TRIAMCINOLONE ACETONIDE 40 MG/ML IJ SUSP (RADIOLOGY)
60.0000 mg | Freq: Once | INTRAMUSCULAR | Status: AC
  Administered 2022-08-10: 60 mg via EPIDURAL

## 2022-08-10 NOTE — Discharge Instructions (Signed)

## 2022-11-14 ENCOUNTER — Encounter: Payer: Self-pay | Admitting: Sports Medicine

## 2023-07-22 ENCOUNTER — Ambulatory Visit: Payer: BC Managed Care – PPO | Admitting: Sports Medicine

## 2023-07-22 DIAGNOSIS — M25561 Pain in right knee: Secondary | ICD-10-CM

## 2023-07-22 DIAGNOSIS — M5412 Radiculopathy, cervical region: Secondary | ICD-10-CM

## 2023-07-22 MED ORDER — MELOXICAM 15 MG PO TABS: ORAL_TABLET | ORAL | 3 refills | Status: AC

## 2023-07-22 MED ORDER — DULOXETINE HCL 30 MG PO CPEP: 30.0000 mg | ORAL_CAPSULE | Freq: Every day | ORAL | 3 refills | Status: DC

## 2023-07-22 NOTE — Assessment & Plan Note (Signed)
This is an exquisitely pleasant 47 year old female, she does really well with cervical epidurals, the last epidural was in January of this year. She did well until recently, worsening C7 left-sided radiculitis. We discussed options including restarting gabapentin, SNRIs, home PT, and repeat epidural. Gabapentin does create drowsiness so we are going to avoid this, we will add Cymbalta low-dose, she does endorse significant life stressors particularly with relation to basketball. She understands that side effects of Cymbalta do include improved mood and decreased anxiety but also potentially dysorgasmia or anorgasmia. She also understands it can take 6 weeks to work and she can call me for an epidural in the meantime if need be. I would like to see her back in approximately 2 months to make sure things are working well.

## 2023-07-22 NOTE — Progress Notes (Signed)
    Procedures performed today:    None.  Independent interpretation of notes and tests performed by another provider:   None.  Brief History, Exam, Impression, and Recommendations:    Cervical radiculopathy This is an exquisitely pleasant 47 year old female, she does really well with cervical epidurals, the last epidural was in January of this year. She did well until recently, worsening C7 left-sided radiculitis. We discussed options including restarting gabapentin, SNRIs, home PT, and repeat epidural. Gabapentin does create drowsiness so we are going to avoid this, we will add Cymbalta low-dose, she does endorse significant life stressors particularly with relation to basketball. She understands that side effects of Cymbalta do include improved mood and decreased anxiety but also potentially dysorgasmia or anorgasmia. She also understands it can take 6 weeks to work and she can call me for an epidural in the meantime if need be. I would like to see her back in approximately 2 months to make sure things are working well.    ____________________________________________ Ihor Austin. Benjamin Stain, M.D., ABFM., CAQSM., AME. Primary Care and Sports Medicine [facility]  Adjunct Professor of Family Medicine  [place] [employer] [facility] [employer]  Restaurant manager, fast food

## 2023-08-23 ENCOUNTER — Other Ambulatory Visit: Payer: Self-pay | Admitting: Obstetrics and Gynecology

## 2023-08-23 DIAGNOSIS — C569 Malignant neoplasm of unspecified ovary: Secondary | ICD-10-CM

## 2023-08-29 ENCOUNTER — Ambulatory Visit: Payer: 59

## 2023-08-29 DIAGNOSIS — C569 Malignant neoplasm of unspecified ovary: Secondary | ICD-10-CM | POA: Diagnosis not present

## 2023-08-29 MED ORDER — IOHEXOL 9 MG/ML PO SOLN
500.0000 mL | ORAL | Status: AC
  Administered 2023-08-29 (×2): 500 mL via ORAL

## 2023-08-29 MED ORDER — IOHEXOL 300 MG/ML  SOLN
100.0000 mL | Freq: Once | INTRAMUSCULAR | Status: AC | PRN
  Administered 2023-08-29: 100 mL via INTRAVENOUS

## 2023-08-29 MED ORDER — IOHEXOL 9 MG/ML PO SOLN: 500.0000 mL | ORAL | Status: AC

## 2023-10-05 ENCOUNTER — Ambulatory Visit: Payer: 59

## 2023-10-05 ENCOUNTER — Encounter: Payer: Self-pay | Admitting: Physician Assistant

## 2023-10-05 ENCOUNTER — Ambulatory Visit (INDEPENDENT_AMBULATORY_CARE_PROVIDER_SITE_OTHER): Payer: 59 | Admitting: Physician Assistant

## 2023-10-05 VITALS — BP 130/90 | HR 56 | Ht 71.0 in | Wt 206.8 lb

## 2023-10-05 DIAGNOSIS — Z1509 Genetic susceptibility to other malignant neoplasm: Secondary | ICD-10-CM

## 2023-10-05 DIAGNOSIS — C569 Malignant neoplasm of unspecified ovary: Secondary | ICD-10-CM

## 2023-10-05 DIAGNOSIS — M89319 Hypertrophy of bone, unspecified shoulder: Secondary | ICD-10-CM | POA: Diagnosis not present

## 2023-10-05 DIAGNOSIS — Z8543 Personal history of malignant neoplasm of ovary: Secondary | ICD-10-CM

## 2023-10-05 NOTE — Patient Instructions (Signed)
 Get x-ray

## 2023-10-05 NOTE — Progress Notes (Signed)
 Shay,   No signs of arthritis. No findings suggestive of mass/tumor. Normal alignment. No soft tissue swelling. GREAT news. I will send to PCP. If clavicle continues to enlarge I suggest follow up with more imaging but everything looks great today!

## 2023-10-05 NOTE — Progress Notes (Signed)
   Established Patient Office Visit  Subjective   Patient ID: Tammie Jones, female    DOB: Jan 11, 1976  Age: 48 y.o. MRN: [id number]  CC: lump on collar bone  HPI Patient is a 48 yo female who presents with a chief complaint of a lump on her left collar bone. She just noticed this Saturday. She does lift weights and has full range of motion. It is non-painful. She denies any issues swallowing.   She does state that her brother had a similar lump found in his collar bone region and passed away 6 months later. She is very anxious about this and has not been able to sleep much since noticing it.   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 She was last seen on 09/02/23.   Review of Systems  All other systems reviewed and are negative.    Objective:    BP (!) 130/90 (BP Location: Left Arm, Patient Position: Sitting, Cuff Size: Large)   Pulse (!) 56   Ht 5\' 11"  (1.803 m)   Wt 93.8 kg   SpO2 97%   BMI 28.84 kg/m  BP Readings from Last 3 Encounters:  10/05/23 (!) 130/90  08/10/22 (!) 146/86  04/21/22 128/81   Wt Readings from Last 3 Encounters:  10/05/23 93.8 kg  02/08/22 83.9 kg  10/01/21 80 kg   SpO2 Readings from Last 3 Encounters:  10/05/23 97%  04/21/22 98%  02/08/22 98%     Physical Exam Constitutional:      Appearance: She is obese.  HENT:     Head: Normocephalic and atraumatic.  Neck:     Comments: 1.5 inch lump in diameter that is firm, non-mobile, and non-tender to palpation on the left clavicle.  Cardiovascular:     Rate and Rhythm: Normal rate and regular rhythm.     Pulses: Normal pulses.     Heart sounds: Normal heart sounds.  Pulmonary:     Effort: Pulmonary effort is normal.     Breath sounds: Normal breath sounds.  Musculoskeletal:        General: Normal range of motion.     Cervical back: Normal range of motion.  Skin:    General: Skin is warm.  Neurological:     Mental Status: She  is alert.    Assessment & Plan:  Marland KitchenMarland KitchenDenitra "Danella Deis" was seen today for mass.  Diagnoses and all orders for this visit:  Clavicle enlargement -     DG Clavicle Left; Future -     CBC w/Diff/Platelet -     CMP14+EGFR -     Protein electrophoresis, serum -     Lactate dehydrogenase -     Sed Rate (ESR)  History of ovarian cancer -     CBC w/Diff/Platelet -     CMP14+EGFR -     Protein electrophoresis, serum -     Lactate dehydrogenase -     Sed Rate (ESR)  Lynch syndrome -     CBC w/Diff/Platelet -     CMP14+EGFR -     Protein electrophoresis, serum -     Lactate dehydrogenase -     Sed Rate (ESR)    - Clavicle X-ray ordered  - Discussed recent CT results from [facility] that was done on 08/29/23.  - Ordered CBC, ESR, CMP, Protein electrophoresis, LDH.    Ilean China, Student-PA

## 2023-10-07 ENCOUNTER — Encounter: Payer: Self-pay | Admitting: Physician Assistant

## 2023-10-07 DIAGNOSIS — Z1509 Genetic susceptibility to other malignant neoplasm: Secondary | ICD-10-CM | POA: Insufficient documentation

## 2023-10-07 DIAGNOSIS — Z8543 Personal history of malignant neoplasm of ovary: Secondary | ICD-10-CM | POA: Insufficient documentation

## 2023-10-07 LAB — CBC WITH DIFFERENTIAL/PLATELET
Basophils Absolute: 0 10*3/uL (ref 0.0–0.2)
Basos: 1 %
EOS (ABSOLUTE): 0.1 10*3/uL (ref 0.0–0.4)
Eos: 3 %
Hematocrit: 39.9 % (ref 34.0–46.6)
Hemoglobin: 13.3 g/dL (ref 11.1–15.9)
Immature Grans (Abs): 0 10*3/uL (ref 0.0–0.1)
Immature Granulocytes: 0 %
Lymphocytes Absolute: 1.4 10*3/uL (ref 0.7–3.1)
Lymphs: 31 %
MCH: 29.7 pg (ref 26.6–33.0)
MCHC: 33.3 g/dL (ref 31.5–35.7)
MCV: 89 fL (ref 79–97)
Monocytes Absolute: 0.4 10*3/uL (ref 0.1–0.9)
Monocytes: 8 %
Neutrophils Absolute: 2.6 10*3/uL (ref 1.4–7.0)
Neutrophils: 57 %
Platelets: 200 10*3/uL (ref 150–450)
RBC: 4.48 x10E6/uL (ref 3.77–5.28)
RDW: 12.5 % (ref 11.7–15.4)
WBC: 4.6 10*3/uL (ref 3.4–10.8)

## 2023-10-07 LAB — CMP14+EGFR
ALT: 25 IU/L (ref 0–32)
AST: 28 IU/L (ref 0–40)
Albumin: 4.2 g/dL (ref 3.9–4.9)
Alkaline Phosphatase: 70 IU/L (ref 44–121)
BUN/Creatinine Ratio: 17 (ref 9–23)
BUN: 14 mg/dL (ref 6–24)
Bilirubin Total: 0.3 mg/dL (ref 0.0–1.2)
CO2: 23 mmol/L (ref 20–29)
Calcium: 9.9 mg/dL (ref 8.7–10.2)
Chloride: 103 mmol/L (ref 96–106)
Creatinine, Ser: 0.82 mg/dL (ref 0.57–1.00)
Globulin, Total: 2.1 g/dL (ref 1.5–4.5)
Glucose: 93 mg/dL (ref 70–99)
Potassium: 4.2 mmol/L (ref 3.5–5.2)
Sodium: 139 mmol/L (ref 134–144)
Total Protein: 6.3 g/dL (ref 6.0–8.5)
eGFR: 88 mL/min/{1.73_m2} (ref >59)

## 2023-10-07 LAB — PROTEIN ELECTROPHORESIS, SERUM
A/G Ratio: 1.4 (ref 0.7–1.7)
Albumin ELP: 3.7 g/dL (ref 2.9–4.4)
Alpha 1: 0.2 g/dL (ref 0.0–0.4)
Alpha 2: 0.5 g/dL (ref 0.4–1.0)
Beta: 1 g/dL (ref 0.7–1.3)
Gamma Globulin: 0.9 g/dL (ref 0.4–1.8)
Globulin, Total: 2.6 g/dL (ref 2.2–3.9)

## 2023-10-07 LAB — LACTATE DEHYDROGENASE: LDH: 268 IU/L — ABNORMAL HIGH (ref 119–226)

## 2023-10-07 LAB — SEDIMENTATION RATE: Sed Rate: 5 mm/h (ref 0–32)

## 2023-10-07 NOTE — Progress Notes (Signed)
 Shay,   Xray again did not show any aggressive or abnormal features.  Protein electrophoresis normal.  Inflammation rate normal.  Kidney, liver, glucose look good.  LDH is elevated but likely all due to ovarian cancer.

## 2023-10-17 ENCOUNTER — Other Ambulatory Visit: Payer: Self-pay | Admitting: Obstetrics and Gynecology

## 2023-10-17 DIAGNOSIS — C569 Malignant neoplasm of unspecified ovary: Secondary | ICD-10-CM

## 2023-11-21 ENCOUNTER — Other Ambulatory Visit: Payer: Self-pay

## 2023-11-21 DIAGNOSIS — M5412 Radiculopathy, cervical region: Secondary | ICD-10-CM

## 2023-11-21 MED ORDER — DULOXETINE HCL 30 MG PO CPEP: 30.0000 mg | ORAL_CAPSULE | Freq: Every day | ORAL | 0 refills | Status: AC

## 2023-11-23 ENCOUNTER — Ambulatory Visit (INDEPENDENT_AMBULATORY_CARE_PROVIDER_SITE_OTHER)

## 2023-11-23 DIAGNOSIS — C569 Malignant neoplasm of unspecified ovary: Secondary | ICD-10-CM | POA: Diagnosis not present

## 2023-11-23 MED ORDER — IOHEXOL 300 MG/ML  SOLN
100.0000 mL | Freq: Once | INTRAMUSCULAR | Status: AC | PRN
  Administered 2023-11-23: 100 mL via INTRAVENOUS

## 2023-11-23 MED ORDER — IOHEXOL 9 MG/ML PO SOLN
500.0000 mL | ORAL | Status: AC
  Administered 2023-11-23 (×2): 500 mL via ORAL

## 2024-03-26 NOTE — Progress Notes (Unsigned)
   Complete physical exam  Patient: Tammie Jones   DOB: September 22, 1975   48 y.o. Female  MRN: [id number]  Subjective:    No chief complaint on file.   Karle Hittle is a 48 y.o. female who presents today for a complete physical exam. She reports consuming a {diet types:17450} diet. {types:19826} She generally feels {DESC; WELL/FAIRLY WELL/POORLY:18703}. She reports sleeping {DESC; WELL/FAIRLY WELL/POORLY:18703}. She {does/does not:200015} have additional problems to discuss today.    Most recent fall risk assessment:    10/01/2021    3:34 PM  Fall Risk   Falls in the past year? 0  Number falls in past yr: 0  Injury with Fall? 0  Risk for fall due to : No Fall Risks  Follow up Falls evaluation completed      Data saved with a previous flowsheet row definition     Most recent depression screenings:    10/01/2021    3:34 PM 09/23/2021   11:33 AM  PHQ 2/9 Scores  PHQ - 2 Score 0 0    {VISON DENTAL STD PSA (Optional):27386}  {History (Optional):23778}  Patient Care Team: Willo Mini, NP as PCP - General (Nurse Practitioner)   Outpatient Medications Prior to Visit  Medication Sig   DULoxetine  (CYMBALTA ) 30 MG capsule Take 1 capsule (30 mg total) by mouth daily. NEEDS APPOINTMENT FOR FURTHER REFILLS.   meloxicam  (MOBIC ) 15 MG tablet One tab PO qAM with a meal for 2 weeks, then daily prn pain.   No facility-administered medications prior to visit.    ROS        Objective:     There were no vitals taken for this visit. {Vitals History (Optional):23777}  Physical Exam   No results found for any visits on 03/27/24. {Show previous labs (optional):23779}    Assessment & Plan:    Routine Health Maintenance and Physical Exam  Immunization History  Administered Date(s) Administered   Influenza,inj,Quad PF,6+ Mos 05/14/2019, 05/06/2020   Tdap 02/23/2013    Health Maintenance  Topic Date Due   COVID-19 Vaccine (1) Never done   Hepatitis B Vaccines 19-59 Average  Risk (1 of 3 - 19+ 3-dose series) Never done   Zoster Vaccines- Shingrix (1 of 2) Never done   Colonoscopy  Never done   DTaP/Tdap/Td (2 - Td or Tdap) 02/24/2023   Pneumococcal Vaccine: 50+ Years (1 of 1 - PCV) Never done   MAMMOGRAM  Never done   INFLUENZA VACCINE  03/09/2024   Hepatitis C Screening  Completed   HIV Screening  Completed   HPV VACCINES  Aged Out   Meningococcal B Vaccine  Aged Out    Discussed health benefits of physical activity, and encouraged her to engage in regular exercise appropriate for her age and condition.  Problem List Items Addressed This Visit   None Visit Diagnoses       Annual physical exam    -  Primary      No follow-ups on file.     Alsie Younes, NP

## 2024-03-26 NOTE — Patient Instructions (Signed)
 Preventive Care 58-50 Years Old, Female  Preventive care refers to lifestyle choices and visits with your health care provider that can promote health and wellness. Preventive care visits are also called wellness exams.  What can I expect for my preventive care visit?  Counseling  Your health care provider may ask you questions about your:  Medical history, including:  Past medical problems.  Family medical history.  Pregnancy history.  Current health, including:  Menstrual cycle.  Method of birth control.  Emotional well-being.  Home life and relationship well-being.  Sexual activity and sexual health.  Lifestyle, including:  Alcohol, nicotine or tobacco, and drug use.  Access to firearms.  Diet, exercise, and sleep habits.  Work and work Astronomer.  Sunscreen use.  Safety issues such as seatbelt and bike helmet use.  Physical exam  Your health care provider will check your:  Height and weight. These may be used to calculate your BMI (body mass index). BMI is a measurement that tells if you are at a healthy weight.  Waist circumference. This measures the distance around your waistline. This measurement also tells if you are at a healthy weight and may help predict your risk of certain diseases, such as type 2 diabetes and high blood pressure.  Heart rate and blood pressure.  Body temperature.  Skin for abnormal spots.  What immunizations do I need?    Vaccines are usually given at various ages, according to a schedule. Your health care provider will recommend vaccines for you based on your age, medical history, and lifestyle or other factors, such as travel or where you work.  What tests do I need?  Screening  Your health care provider may recommend screening tests for certain conditions. This may include:  Lipid and cholesterol levels.  Diabetes screening. This is done by checking your blood sugar (glucose) after you have not eaten for a while (fasting).  Pelvic exam and Pap test.  Hepatitis B test.  Hepatitis C  test.  HIV (human immunodeficiency virus) test.  STI (sexually transmitted infection) testing, if you are at risk.  Lung cancer screening.  Colorectal cancer screening.  Mammogram. Talk with your health care provider about when you should start having regular mammograms. This may depend on whether you have a family history of breast cancer.  BRCA-related cancer screening. This may be done if you have a family history of breast, ovarian, tubal, or peritoneal cancers.  Bone density scan. This is done to screen for osteoporosis.  Talk with your health care provider about your test results, treatment options, and if necessary, the need for more tests.  Follow these instructions at home:  Eating and drinking    Eat a diet that includes fresh fruits and vegetables, whole grains, lean protein, and low-fat dairy products.  Take vitamin and mineral supplements as recommended by your health care provider.  Do not drink alcohol if:  Your health care provider tells you not to drink.  You are pregnant, may be pregnant, or are planning to become pregnant.  If you drink alcohol:  Limit how much you have to 0-1 drink a day.  Know how much alcohol is in your drink. In the [place] one drink equals one 12 oz bottle of beer (355 mL), one 5 oz glass of wine (148 mL), or one 1 oz glass of hard liquor (44 mL).  Lifestyle  Brush your teeth every morning and night with fluoride toothpaste. Floss one time each day.  Exercise for at least  30 minutes 5 or more days each week.  Do not use any products that contain nicotine or tobacco. These products include cigarettes, chewing tobacco, and vaping devices, such as e-cigarettes. If you need help quitting, ask your health care provider.  Do not use drugs.  If you are sexually active, practice safe sex. Use a condom or other form of protection to prevent STIs.  If you do not wish to become pregnant, use a form of birth control. If you plan to become pregnant, see your health care provider for a  prepregnancy visit.  Take aspirin only as told by your health care provider. Make sure that you understand how much to take and what form to take. Work with your health care provider to find out whether it is safe and beneficial for you to take aspirin daily.  Find healthy ways to manage stress, such as:  Meditation, yoga, or listening to music.  Journaling.  Talking to a trusted person.  Spending time with friends and family.  Minimize exposure to UV radiation to reduce your risk of skin cancer.  Safety  Always wear your seat belt while driving or riding in a vehicle.  Do not drive:  If you have been drinking alcohol. Do not ride with someone who has been drinking.  When you are tired or distracted.  While texting.  If you have been using any mind-altering substances or drugs.  Wear a helmet and other protective equipment during sports activities.  If you have firearms in your house, make sure you follow all gun safety procedures.  Seek help if you have been physically or sexually abused.  What's next?  Visit your health care provider once a year for an annual wellness visit.  Ask your health care provider how often you should have your eyes and teeth checked.  Stay up to date on all vaccines.  This information is not intended to replace advice given to you by your health care provider. Make sure you discuss any questions you have with your health care provider.  Document Revised: 01/21/2021 Document Reviewed: 01/21/2021  Elsevier Patient Education  2024 [employer].

## 2024-03-27 ENCOUNTER — Ambulatory Visit (INDEPENDENT_AMBULATORY_CARE_PROVIDER_SITE_OTHER): Admitting: Medical-Surgical

## 2024-03-27 VITALS — BP 123/80 | HR 53 | Temp 99.1°F | Resp 20 | Ht 71.0 in | Wt 214.0 lb

## 2024-03-27 DIAGNOSIS — Z1231 Encounter for screening mammogram for malignant neoplasm of breast: Secondary | ICD-10-CM

## 2024-03-27 DIAGNOSIS — Z23 Encounter for immunization: Secondary | ICD-10-CM

## 2024-03-27 DIAGNOSIS — Z13220 Encounter for screening for lipoid disorders: Secondary | ICD-10-CM

## 2024-03-27 DIAGNOSIS — Z1211 Encounter for screening for malignant neoplasm of colon: Secondary | ICD-10-CM | POA: Diagnosis not present

## 2024-03-27 DIAGNOSIS — Z0000 Encounter for general adult medical examination without abnormal findings: Secondary | ICD-10-CM

## 2024-03-27 DIAGNOSIS — Z131 Encounter for screening for diabetes mellitus: Secondary | ICD-10-CM

## 2024-03-27 MED ORDER — NALTREXONE-BUPROPION HCL ER 8-90 MG PO TB12: ORAL_TABLET | ORAL | 0 refills | Status: AC

## 2024-03-28 ENCOUNTER — Ambulatory Visit: Payer: Self-pay | Admitting: Medical-Surgical

## 2024-03-28 LAB — CMP14+EGFR
ALT: 17 IU/L (ref 0–32)
AST: 26 IU/L (ref 0–40)
Albumin: 4.3 g/dL (ref 3.9–4.9)
Alkaline Phosphatase: 70 IU/L (ref 44–121)
BUN/Creatinine Ratio: 13 (ref 9–23)
BUN: 11 mg/dL (ref 6–24)
Bilirubin Total: 0.5 mg/dL (ref 0.0–1.2)
CO2: 22 mmol/L (ref 20–29)
Calcium: 10.1 mg/dL (ref 8.7–10.2)
Chloride: 106 mmol/L (ref 96–106)
Creatinine, Ser: 0.87 mg/dL (ref 0.57–1.00)
Globulin, Total: 2.5 g/dL (ref 1.5–4.5)
Glucose: 100 mg/dL — ABNORMAL HIGH (ref 70–99)
Potassium: 4.5 mmol/L (ref 3.5–5.2)
Sodium: 142 mmol/L (ref 134–144)
Total Protein: 6.8 g/dL (ref 6.0–8.5)
eGFR: 81 mL/min/1.73 (ref >59)

## 2024-03-28 LAB — CBC WITH DIFFERENTIAL/PLATELET
Basophils Absolute: 0 x10E3/uL (ref 0.0–0.2)
Basos: 1 %
EOS (ABSOLUTE): 0.1 x10E3/uL (ref 0.0–0.4)
Eos: 3 %
Hematocrit: 41.3 % (ref 34.0–46.6)
Hemoglobin: 13.4 g/dL (ref 11.1–15.9)
Immature Grans (Abs): 0 x10E3/uL (ref 0.0–0.1)
Immature Granulocytes: 0 %
Lymphocytes Absolute: 1.3 x10E3/uL (ref 0.7–3.1)
Lymphs: 30 %
MCH: 29.1 pg (ref 26.6–33.0)
MCHC: 32.4 g/dL (ref 31.5–35.7)
MCV: 90 fL (ref 79–97)
Monocytes Absolute: 0.3 x10E3/uL (ref 0.1–0.9)
Monocytes: 7 %
Neutrophils Absolute: 2.5 x10E3/uL (ref 1.4–7.0)
Neutrophils: 59 %
Platelets: 203 x10E3/uL (ref 150–450)
RBC: 4.6 x10E6/uL (ref 3.77–5.28)
RDW: 12.8 % (ref 11.7–15.4)
WBC: 4.2 x10E3/uL (ref 3.4–10.8)

## 2024-03-28 LAB — LIPID PANEL
Chol/HDL Ratio: 3 ratio (ref 0.0–4.4)
Cholesterol, Total: 211 mg/dL — ABNORMAL HIGH (ref 100–199)
HDL: 71 mg/dL (ref >39)
LDL Chol Calc (NIH): 128 mg/dL — ABNORMAL HIGH (ref 0–99)
Triglycerides: 66 mg/dL (ref 0–149)
VLDL Cholesterol Cal: 12 mg/dL (ref 5–40)

## 2024-03-28 LAB — HEMOGLOBIN A1C
Est. average glucose Bld gHb Est-mCnc: 114 mg/dL
Hgb A1c MFr Bld: 5.6 % (ref 4.8–5.6)

## 2024-04-10 ENCOUNTER — Encounter: Payer: Self-pay | Admitting: Sports Medicine

## 2024-05-01 ENCOUNTER — Telehealth: Admitting: Physician Assistant

## 2024-05-01 DIAGNOSIS — B9689 Other specified bacterial agents as the cause of diseases classified elsewhere: Secondary | ICD-10-CM

## 2024-05-01 DIAGNOSIS — J069 Acute upper respiratory infection, unspecified: Secondary | ICD-10-CM | POA: Diagnosis not present

## 2024-05-01 MED ORDER — AZITHROMYCIN 250 MG PO TABS: ORAL_TABLET | ORAL | 0 refills | Status: AC

## 2024-05-01 MED ORDER — PSEUDOEPH-BROMPHEN-DM 30-2-10 MG/5ML PO SYRP: 5.0000 mL | ORAL_SOLUTION | Freq: Four times a day (QID) | ORAL | 0 refills | Status: DC | PRN

## 2024-05-01 NOTE — Progress Notes (Signed)
 Virtual Visit Consent   Tammie Jones, you are scheduled for a virtual visit with a [facility] provider today. Just as with appointments in the office, your consent must be obtained to participate. Your consent will be active for this visit and any virtual visit you may have with one of our providers in the next 365 days. If you have a MyChart account, a copy of this consent can be sent to you electronically.  As this is a virtual visit, video technology does not allow for your provider to perform a traditional examination. This may limit your provider's ability to fully assess your condition. If your provider identifies any concerns that need to be evaluated in person or the need to arrange testing (such as labs, EKG, etc.), we will make arrangements to do so. Although advances in technology are sophisticated, we cannot ensure that it will always work on either your end or our end. If the connection with a video visit is poor, the visit may have to be switched to a telephone visit. With either a video or telephone visit, we are not always able to ensure that we have a secure connection.  By engaging in this virtual visit, you consent to the provision of healthcare and authorize for your insurance to be billed (if applicable) for the services provided during this visit. Depending on your insurance coverage, you may receive a charge related to this service.  I need to obtain your verbal consent now. Are you willing to proceed with your visit today? Lakeyshia Labell has provided verbal consent on 05/01/2024 for a virtual visit (video or telephone). Delon CHRISTELLA Dickinson, PA-C  Date: 05/01/2024 12:47 PM   Virtual Visit via Video Note   I, Delon CHRISTELLA Dickinson, connected with  Mikala Podoll  ([id number], 1976/02/14) on 05/01/24 at 12:30 PM EDT by a video-enabled telemedicine application and verified that I am speaking with the correct person using two identifiers.  Location: Patient: Virtual Visit Location  Patient: Home Provider: Virtual Visit Location Provider: Home Office   I discussed the limitations of evaluation and management by telemedicine and the availability of in person appointments. The patient expressed understanding and agreed to proceed.    History of Present Illness: Tammie Jones is a 48 y.o. who identifies as a female who was assigned female at birth, and is being seen today for cough and congestion.  HPI: URI  This is a new problem. The current episode started in the past 7 days. The problem has been gradually worsening. There has been no fever. Associated symptoms include congestion (throat and chest only), coughing, diarrhea (monday), headaches, nausea (monday), a plugged ear sensation, rhinorrhea (and post nasal drainage), sinus pain, a sore throat and swollen glands (tender to touch). Pertinent negatives include no ear pain, sneezing, vomiting or wheezing. Treatments tried: mucinex, vitamins, vicks. The treatment provided no relief.     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 NEEDS APPOINTMENT FOR FURTHER REFILLS., Disp: 30 capsule, Rfl: 0   meloxicam  (MOBIC ) 15 MG tablet, One tab PO qAM with a meal for 2 weeks, then daily prn pain., Disp: 90 tablet, Rfl: 3   Naltrexone -buPROPion  HCl ER 8-90 MG TB12, 1 tab daily for week 1, then 1 tab BID for week 2, then 2 tab PO qAM and 1 tab PO qPM for week 3, then 2 tabs BID., Disp: 120 tablet, Rfl: 0  Observations/Objective: Patient is well-developed, well-nourished in no acute distress.  Resting comfortably at home.  Head is normocephalic, atraumatic.  No labored breathing.  Speech is clear and coherent with logical content.  Patient is alert and oriented at baseline.    Assessment and Plan: 1. Bacterial upper respiratory infection (Primary) - azithromycin  (ZITHROMAX ) 250 MG tablet; Take 2 tablets on day 1, then 1 tablet daily on days 2 through 5  Dispense: 6 tablet; Refill: 0 - brompheniramine-pseudoephedrine-DM 30-2-10 MG/5ML syrup; Take 5 mLs by mouth 4 (four) times daily as needed.  Dispense: 120 mL; Refill: 0  - Worsening symptoms that have not responded to OTC medications.  - Will give Azithromycin  and Bromfed DM - Can continue PLAIN Mucinex during the day - Continue allergy medications.  - Steam and humidifier can help - Stay well hydrated and get plenty of rest.  - Seek in person evaluation if no symptom improvement or if symptoms worsen   Follow Up Instructions: I discussed the assessment and treatment plan with the patient. The patient was provided an opportunity to ask questions and all were answered. The patient agreed with the plan and demonstrated an understanding of the instructions.  A copy of instructions were sent to the patient via MyChart unless otherwise noted below.    The patient was advised to call back or seek an in-person evaluation if the symptoms  worsen or if the condition fails to improve as anticipated.    Delon CHRISTELLA Dickinson, PA-C

## 2024-05-01 NOTE — Patient Instructions (Signed)
 Sandee Hint, thank you for joining Delon CHRISTELLA Dickinson, PA-C for today's virtual visit.  While this provider is not your primary care provider (PCP), if your PCP is located in our provider database this encounter information will be shared with them immediately following your visit.   A [facility] MyChart account gives you access to today's visit and all your visits, tests, and labs performed at [facility]  click here if you don't have a [facility] MyChart account or go to [email or web address]  Consent: (Patient) Tammie Jones provided verbal consent for this virtual visit at the beginning of the encounter.  Current Medications:  Current Outpatient Medications:    azithromycin  (ZITHROMAX ) 250 MG tablet, Take 2 tablets on day 1, then 1 tablet daily on days 2 through 5, Disp: 6 tablet, Rfl: 0   brompheniramine-pseudoephedrine-DM 30-2-10 MG/5ML syrup, Take 5 mLs by mouth 4 (four) times daily as needed., Disp: 120 mL, Rfl: 0   DULoxetine  (CYMBALTA ) 30 MG capsule, Take 1 capsule (30 mg total) by mouth daily. NEEDS APPOINTMENT FOR FURTHER REFILLS., Disp: 30 capsule, Rfl: 0   meloxicam  (MOBIC ) 15 MG tablet, One tab PO qAM with a meal for 2 weeks, then daily prn pain., Disp: 90 tablet, Rfl: 3   Naltrexone -buPROPion  HCl ER 8-90 MG TB12, 1 tab daily for week 1, then 1 tab BID for week 2, then 2 tab PO qAM and 1 tab PO qPM for week 3, then 2 tabs BID., Disp: 120 tablet, Rfl: 0   Medications ordered in this encounter:  Meds ordered this encounter  Medications   azithromycin  (ZITHROMAX ) 250 MG tablet    Sig: Take 2 tablets on day 1, then 1 tablet daily on days 2 through 5    Dispense:  6 tablet    Refill:  0    Supervising Provider:   LAMPTEY, PHILIP O [[id number]]   brompheniramine-pseudoephedrine-DM 30-2-10 MG/5ML syrup    Sig: Take 5 mLs by mouth 4 (four) times daily as needed.    Dispense:  120 mL    Refill:  0    Supervising Provider:   BLAISE ALEENE KIDD [[id number]]     *If  you need refills on other medications prior to your next appointment, please contact your pharmacy*  Follow-Up: Call back or seek an in-person evaluation if the symptoms worsen or if the condition fails to improve as anticipated.  [facility] [phone]  Other Instructions Upper Respiratory Infection, Adult An upper respiratory infection (URI) is a common viral infection of the nose, throat, and upper air passages that lead to the lungs. The most common type of URI is the common cold. URIs usually get better on their own, without medical treatment. What are the causes? A URI is caused by a virus. You may catch a virus by: Breathing in droplets from an infected person's cough or sneeze. Touching something that has been exposed to the virus (is contaminated) and then touching your mouth, nose, or eyes. What increases the risk? You are more likely to get a URI if: You are very young or very old. You have close contact with others, such as at work, school, or a health care facility. You smoke. You have long-term (chronic) heart or lung disease. You have a weakened disease-fighting system (immune system). You have nasal allergies or asthma. You are experiencing a lot of stress. You have poor nutrition. What are the signs or symptoms? A URI usually involves some of the following symptoms: Runny or stuffy (  congested) nose. Cough. Sneezing. Sore throat. Headache. Fatigue. Fever. Loss of appetite. Pain in your forehead, behind your eyes, and over your cheekbones (sinus pain). Muscle aches. Redness or irritation of the eyes. Pressure in the ears or face. How is this diagnosed? This condition may be diagnosed based on your medical history and symptoms, and a physical exam. Your health care provider may use a swab to take a mucus sample from your nose (nasal swab). This sample can be tested to determine what virus is causing the illness. How is this treated? URIs usually  get better on their own within 7-10 days. Medicines cannot cure URIs, but your health care provider may recommend certain medicines to help relieve symptoms, such as: Over-the-counter cold medicines. Cough suppressants. Coughing is a type of defense against infection that helps to clear the respiratory system, so take these medicines only as recommended by your health care provider. Fever-reducing medicines. Follow these instructions at home: Activity Rest as needed. If you have a fever, stay home from work or school until your fever is gone or until your health care provider says your URI cannot spread to other people (is no longer contagious). Your health care provider may have you wear a face mask to prevent your infection from spreading. Relieving symptoms Gargle with a mixture of salt and water 3-4 times a day or as needed. To make salt water, completely dissolve -1 tsp (3-6 g) of salt in 1 cup (237 mL) of warm water. Use a cool-mist humidifier to add moisture to the air. This can help you breathe more easily. Eating and drinking  Drink enough fluid to keep your urine pale yellow. Eat soups and other clear broths. General instructions  Take over-the-counter and prescription medicines only as told by your health care provider. These include cold medicines, fever reducers, and cough suppressants. Do not use any products that contain nicotine or tobacco. These products include cigarettes, chewing tobacco, and vaping devices, such as e-cigarettes. If you need help quitting, ask your health care provider. Stay away from secondhand smoke. Stay up to date on all immunizations, including the yearly (annual) flu vaccine. Keep all follow-up visits. This is important. How to prevent the spread of infection to others URIs can be contagious. To prevent the infection from spreading: Wash your hands with soap and water for at least 20 seconds. If soap and water are not available, use hand  sanitizer. Avoid touching your mouth, face, eyes, or nose. Cough or sneeze into a tissue or your sleeve or elbow instead of into your hand or into the air.  Contact a health care provider if: You are getting worse instead of better. You have a fever or chills. Your mucus is brown or red. You have yellow or brown discharge coming from your nose. You have pain in your face, especially when you bend forward. You have swollen neck glands. You have pain while swallowing. You have white areas in the back of your throat. Get help right away if: You have shortness of breath that gets worse. You have severe or persistent: Headache. Ear pain. Sinus pain. Chest pain. You have chronic lung disease along with any of the following: Making high-pitched whistling sounds when you breathe, most often when you breathe out (wheezing). Prolonged cough (more than 14 days). Coughing up blood. A change in your usual mucus. You have a stiff neck. You have changes in your: Vision. Hearing. Thinking. Mood. These symptoms may be an emergency. Get help right away. Call  911. Do not wait to see if the symptoms will go away. Do not drive yourself to the hospital. Summary An upper respiratory infection (URI) is a common infection of the nose, throat, and upper air passages that lead to the lungs. A URI is caused by a virus. URIs usually get better on their own within 7-10 days. Medicines cannot cure URIs, but your health care provider may recommend certain medicines to help relieve symptoms. This information is not intended to replace advice given to you by your health care provider. Make sure you discuss any questions you have with your health care provider. Document Revised: 02/25/2021 Document Reviewed: 02/25/2021 Elsevier Patient Education  2024 [employer]   If you have been instructed to have an in-person evaluation today at a local Urgent Care facility, please use the link below. It will take  you to a list of all of our available [facility], including address, phone number and hours of operation. Please do not delay care.  [facility]  If you or a family member do not have a primary care provider, use the link below to schedule a visit and establish care. When you choose a [facility] primary care physician or advanced practice provider, you gain a long-term partner in health. Find a Primary Care Provider  Learn more about [facility]'s in-office and virtual care options:  - Get Care Now

## 2024-05-03 ENCOUNTER — Ambulatory Visit

## 2024-05-03 DIAGNOSIS — Z1231 Encounter for screening mammogram for malignant neoplasm of breast: Secondary | ICD-10-CM

## 2024-05-31 ENCOUNTER — Ambulatory Visit

## 2024-06-04 ENCOUNTER — Ambulatory Visit (HOSPITAL_BASED_OUTPATIENT_CLINIC_OR_DEPARTMENT_OTHER): Admitting: Student

## 2024-06-04 ENCOUNTER — Ambulatory Visit (INDEPENDENT_AMBULATORY_CARE_PROVIDER_SITE_OTHER)

## 2024-06-04 DIAGNOSIS — M25561 Pain in right knee: Secondary | ICD-10-CM

## 2024-06-04 DIAGNOSIS — M1711 Unilateral primary osteoarthritis, right knee: Secondary | ICD-10-CM | POA: Diagnosis not present

## 2024-06-04 NOTE — Progress Notes (Signed)
 Chief Complaint: Right knee pain     History of Present Illness:    Tammie Jones is a 48 y.o. female who presents today for evaluation of right knee pain.  She does have history of a patellar tendon repair approximately 15 years ago that occurred due to a flag football injury.  Her current episode began 1 week ago without any known injury or overuse.  She states that the knee has felt warm, swollen, and persistently a moderate level of pain.  She has tried ibuprofen , ice, as well as topical patches and creams.  She does note some increased popping but no episodes of buckling.  No history of gout and denies any fever or chills.   Surgical History:   Right knee patellar tendon repair ~2010  PMH/PSH/Family History/Social History/Meds/Allergies:    Past Medical History:  Diagnosis Date   Back pain    Cancer (HCC)    Past Surgical History:  Procedure Laterality Date   LUMBAR DISC SURGERY     x2, L4-L5   PATELLAR TENDON REPAIR Bilateral    TOTAL ABDOMINAL HYSTERECTOMY  10/2018   Social History   Socioeconomic History   Marital status: Single    Spouse name: Not on file   Number of children: Not on file   Years of education: Not on file   Highest education level: Master's degree (e.g., MA, MS, MEng, MEd, MSW, MBA)  Occupational History   Not on file  Tobacco Use   Smoking status: Never   Smokeless tobacco: Never  Substance and Sexual Activity   Alcohol use: Yes    Alcohol/week: 2.0 - 3.0 standard drinks of alcohol    Types: 2 - 3 Standard drinks or equivalent per week   Drug use: Never   Sexual activity: Yes    Birth control/protection: Other-see comments    Comment: same sex partner  Other Topics Concern   Not on file  Social History Narrative   Not on file   Social Drivers of Health   Financial Resource Strain: High Risk (03/27/2024)   Overall Financial Resource Strain (CARDIA)    Difficulty of Paying Living Expenses: Hard   Food Insecurity: Food Insecurity Present (03/27/2024)   Hunger Vital Sign    Worried About Running Out of Food in the Last Year: Sometimes true    Ran Out of Food in the Last Year: Never true  Transportation Needs: No Transportation Needs (03/27/2024)   PRAPARE - Administrator, Civil Service (Medical): No    Lack of Transportation (Non-Medical): No  Physical Activity: Insufficiently Active (03/27/2024)   Exercise Vital Sign    Days of Exercise per Week: 1 day    Minutes of Exercise per Session: 30 min  Stress: Stress Concern Present (03/27/2024)   [employer] of Occupational Health - Occupational Stress Questionnaire    Feeling of Stress: Very much  Social Connections: Moderately Integrated (03/27/2024)   Social Connection and Isolation Panel    Frequency of Communication with Friends and Family: Three times a week    Frequency of Social Gatherings with Friends and Family: Twice a week    Attends Religious Services: More than 4 times per year    Active Member of [employer] or Organizations: Yes    Attends Banker Meetings: More than 4 times per  year    Marital Status: Never married   No family history on file. No Known Allergies Current Outpatient Medications  Medication Sig Dispense Refill   brompheniramine-pseudoephedrine-DM 30-2-10 MG/5ML syrup Take 5 mLs by mouth 4 (four) times daily as needed. 120 mL 0   DULoxetine  (CYMBALTA ) 30 MG capsule Take 1 capsule (30 mg total) by mouth daily. NEEDS APPOINTMENT FOR FURTHER REFILLS. 30 capsule 0   meloxicam  (MOBIC ) 15 MG tablet One tab PO qAM with a meal for 2 weeks, then daily prn pain. 90 tablet 3   Naltrexone -buPROPion  HCl ER 8-90 MG TB12 1 tab daily for week 1, then 1 tab BID for week 2, then 2 tab PO qAM and 1 tab PO qPM for week 3, then 2 tabs BID. 120 tablet 0   No current facility-administered medications for this visit.   No results found.  Review of Systems:   A ROS was performed including pertinent  positives and negatives as documented in the HPI.  Physical Exam :   Constitutional: NAD and appears stated age Neurological: Alert and oriented Psych: Appropriate affect and cooperative There were no vitals taken for this visit.   Comprehensive Musculoskeletal Exam:    Exam of the right knee demonstrates a previous surgical scar over the anterior knee.  Active range of motion from 0 to 90 degrees.  Mild to moderate effusion present without overlying erythema or warmth.  Stable collaterals with varus and valgus stress.  Ambulating with mild antalgic gait.  Imaging:   Xray (right knee 4 views): Mild to moderate tricompartmental osteoarthritis with joint space narrowing most notable in the medial compartment as well as diffuse osteophyte formation.  Bony fragments seen off of the inferior pole of the patella grossly unchanged from last radiographs on 07/10/2021.   I personally reviewed and interpreted the radiographs.   Assessment:   48 y.o. female with acute on chronic right knee pain that began 1 week ago without any known injury.  X-rays today do show mild to moderate degenerative changes with minimal progression compared to last x-rays in December 2022.  There is a deformity off of the inferior pole of the patella likely posttraumatic in nature due to her history of patellar tendon repair but no significant changes are noted here.  I believe her symptoms are likely a flare of her underlying osteoarthritis and have recommended a cortisone injection for symptomatic relief.  Patient did have a fairly notable effusion under ultrasound examination therefore decision was made to proceed with aspiration and 40 cc of normal appearing synovial fluid was removed followed by injection which she tolerated well.  Compression was applied and we will plan to have patient continue with activity as tolerated and return as needed.  Plan :    - Right knee aspiration and cortisone injection performed today -  Return to clinic as needed     I personally saw and evaluated the patient, and participated in the management and treatment plan.  Leonce Reveal, PA-C Orthopedics

## 2024-06-11 ENCOUNTER — Ambulatory Visit: Admitting: Medical-Surgical

## 2024-06-11 ENCOUNTER — Encounter: Payer: Self-pay | Admitting: Radiology

## 2024-06-11 ENCOUNTER — Encounter: Payer: Self-pay | Admitting: Medical-Surgical

## 2024-06-11 VITALS — BP 135/88 | HR 59 | Resp 20 | Ht 71.0 in | Wt 216.0 lb

## 2024-06-11 DIAGNOSIS — R1011 Right upper quadrant pain: Secondary | ICD-10-CM | POA: Diagnosis not present

## 2024-06-11 DIAGNOSIS — R198 Other specified symptoms and signs involving the digestive system and abdomen: Secondary | ICD-10-CM

## 2024-06-11 MED ORDER — PANTOPRAZOLE SODIUM 40 MG PO TBEC: 40.0000 mg | DELAYED_RELEASE_TABLET | Freq: Every day | ORAL | 1 refills | Status: DC

## 2024-06-11 MED ORDER — ONDANSETRON 4 MG PO TBDP: 4.0000 mg | ORAL_TABLET | Freq: Three times a day (TID) | ORAL | 0 refills | Status: AC | PRN

## 2024-06-11 NOTE — Progress Notes (Signed)
        Established patient visit   History of Present Illness   Discussed the use of AI scribe software for clinical note transcription with the patient, who gave verbal consent to proceed.  History of Present Illness   Tammie Jones is a 48 year old female who presents with nausea, bloating, and gastrointestinal discomfort.  Gastrointestinal symptoms - Intermittent nausea worsening throughout the day for the past week - Bloating and frequent burping for the past week - Abdominal discomfort without vomiting - Alternating diarrhea and constipation - Green-colored bowel movements, last bowel movement was green without blood, easy to pass - No new foods, medications, supplements, or alcohol consumption in the past week - Associates recent gastrointestinal upset with consuming turkey burgers - No use of medications for gastrointestinal symptoms - Heartburn present - Scratchy, itchy throat sensation accompanying heartburn   Physical Exam   Physical Exam Vitals reviewed.  Constitutional:      General: She is not in acute distress.    Appearance: Normal appearance. She is not ill-appearing.  HENT:     Head: Normocephalic and atraumatic.  Cardiovascular:     Rate and Rhythm: Normal rate and regular rhythm.     Pulses: Normal pulses.     Heart sounds: Normal heart sounds. No murmur heard.    No friction rub. No gallop.  Pulmonary:     Effort: Pulmonary effort is normal. No respiratory distress.     Breath sounds: Normal breath sounds. No wheezing.  Abdominal:     General: Bowel sounds are normal. There is no distension.     Palpations: Abdomen is soft.     Tenderness: There is abdominal tenderness (LLQ and RUQ). There is rebound. Positive signs include Murphy's sign.     Hernia: No hernia is present.  Skin:    General: Skin is warm and dry.  Neurological:     Mental Status: She is alert and oriented to person, place, and time.  Psychiatric:        Mood and Affect:  Mood normal.        Behavior: Behavior normal.        Thought Content: Thought content normal.        Judgment: Judgment normal.    Assessment & Plan   GI symptoms Intermittent symptoms for one week. Differential includes gallbladder issues, heartburn, gastroenteritis, and H. pylori infection. - Order H. pylori breath test. - Order blood work: amylase, lipase, CMP, and CBC. - RUQ ultrasound ordered. - Prescribe Zofran for nausea as needed. - Start Protonix 40mg  daily.   RUQ pain Positive Murphy's sign with nausea and abdominal discomfort. Suspect gall bladder etiology.  - RUQ ultrasound ordered for further evaluation.    Follow up   Return if symptoms worsen or fail to improve. __________________________________ Zada FREDRIK Palin, DNP, APRN, FNP-BC Primary Care and Sports Medicine [facility] [place]

## 2024-06-12 ENCOUNTER — Ambulatory Visit: Payer: Self-pay | Admitting: Medical-Surgical

## 2024-06-13 LAB — CBC WITH DIFFERENTIAL/PLATELET
Basophils Absolute: 0 x10E3/uL (ref 0.0–0.2)
Basos: 1 %
EOS (ABSOLUTE): 0.2 x10E3/uL (ref 0.0–0.4)
Eos: 3 %
Hematocrit: 40.6 % (ref 34.0–46.6)
Hemoglobin: 13.6 g/dL (ref 11.1–15.9)
Immature Grans (Abs): 0 x10E3/uL (ref 0.0–0.1)
Immature Granulocytes: 0 %
Lymphocytes Absolute: 1.7 x10E3/uL (ref 0.7–3.1)
Lymphs: 27 %
MCH: 29.8 pg (ref 26.6–33.0)
MCHC: 33.5 g/dL (ref 31.5–35.7)
MCV: 89 fL (ref 79–97)
Monocytes Absolute: 0.5 x10E3/uL (ref 0.1–0.9)
Monocytes: 8 %
Neutrophils Absolute: 4 x10E3/uL (ref 1.4–7.0)
Neutrophils: 60 %
Platelets: 241 x10E3/uL (ref 150–450)
RBC: 4.56 x10E6/uL (ref 3.77–5.28)
RDW: 12.5 % (ref 11.7–15.4)
WBC: 6.5 x10E3/uL (ref 3.4–10.8)

## 2024-06-13 LAB — CMP14+EGFR
ALT: 19 IU/L (ref 0–32)
AST: 18 IU/L (ref 0–40)
Albumin: 4.5 g/dL (ref 3.9–4.9)
Alkaline Phosphatase: 73 IU/L (ref 41–116)
BUN/Creatinine Ratio: 14 (ref 9–23)
BUN: 12 mg/dL (ref 6–24)
Bilirubin Total: 0.4 mg/dL (ref 0.0–1.2)
CO2: 25 mmol/L (ref 20–29)
Calcium: 10.5 mg/dL — ABNORMAL HIGH (ref 8.7–10.2)
Chloride: 104 mmol/L (ref 96–106)
Creatinine, Ser: 0.87 mg/dL (ref 0.57–1.00)
Globulin, Total: 2.4 g/dL (ref 1.5–4.5)
Glucose: 84 mg/dL (ref 70–99)
Potassium: 4.6 mmol/L (ref 3.5–5.2)
Sodium: 141 mmol/L (ref 134–144)
Total Protein: 6.9 g/dL (ref 6.0–8.5)
eGFR: 81 mL/min/1.73 (ref >59)

## 2024-06-13 LAB — AMYLASE: Amylase: 81 U/L (ref 31–110)

## 2024-06-13 LAB — H. PYLORI BREATH TEST

## 2024-06-13 LAB — LIPASE: Lipase: 45 U/L (ref 14–72)

## 2024-06-21 ENCOUNTER — Telehealth: Payer: Self-pay

## 2024-06-21 NOTE — Telephone Encounter (Signed)
 Left message for a return call about colon cancer screening.

## 2024-06-21 NOTE — Telephone Encounter (Signed)
 Tammie Jones called back and is going to have a colonoscopy soon at the original practice. She will give us  the records once finished.

## 2024-07-03 LAB — HM COLONOSCOPY

## 2024-07-24 ENCOUNTER — Other Ambulatory Visit: Payer: Self-pay

## 2024-07-24 DIAGNOSIS — R198 Other specified symptoms and signs involving the digestive system and abdomen: Secondary | ICD-10-CM

## 2024-07-24 MED ORDER — PANTOPRAZOLE SODIUM 40 MG PO TBEC: 40.0000 mg | DELAYED_RELEASE_TABLET | Freq: Every day | ORAL | 1 refills | Status: DC

## 2024-07-25 ENCOUNTER — Other Ambulatory Visit: Payer: Self-pay

## 2024-07-25 DIAGNOSIS — R198 Other specified symptoms and signs involving the digestive system and abdomen: Secondary | ICD-10-CM

## 2024-07-25 MED ORDER — PANTOPRAZOLE SODIUM 40 MG PO TBEC: 40.0000 mg | DELAYED_RELEASE_TABLET | Freq: Every day | ORAL | 0 refills | Status: AC

## 2024-08-07 ENCOUNTER — Ambulatory Visit (HOSPITAL_BASED_OUTPATIENT_CLINIC_OR_DEPARTMENT_OTHER)

## 2024-08-07 ENCOUNTER — Ambulatory Visit (HOSPITAL_BASED_OUTPATIENT_CLINIC_OR_DEPARTMENT_OTHER): Admitting: Student

## 2024-08-07 DIAGNOSIS — M1711 Unilateral primary osteoarthritis, right knee: Secondary | ICD-10-CM | POA: Diagnosis not present

## 2024-08-07 DIAGNOSIS — M79644 Pain in right finger(s): Secondary | ICD-10-CM

## 2024-08-07 NOTE — Progress Notes (Unsigned)
 "                                Chief Complaint: Right knee pain and thumb weakness     History of Present Illness:   08/07/24: Patient presents today for follow-up of her right knee as well as weakness in her right thumb.  She reports that her knee was feeling some better after aspiration and injection performed on 10/27.  2 weeks ago she was standing on her bathtub attempting to kill a spider on the ceiling when her right knee was forced to bend and felt like it locked up.  Swelling and pain increased after this incident which has resulted in more stiffness.  Occasionally feels as though her knee wants to lock up again.  She is also has been experiencing some weakness in her right thumb without any known injury.  She has a hard time grasping or pinching objects.  Denies any significant pain and no numbness or tingling.   06/04/24: Tammie Jones is a 48 y.o. female who presents today for evaluation of right knee pain.  She does have history of a patellar tendon repair approximately 15 years ago that occurred due to a flag football injury.  Her current episode began 1 week ago without any known injury or overuse.  She states that the knee has felt warm, swollen, and persistently a moderate level of pain.  She has tried ibuprofen , ice, as well as topical patches and creams.  She does note some increased popping but no episodes of buckling.  No history of gout and denies any fever or chills.   Surgical History:   Right knee patellar tendon repair ~2010  PMH/PSH/Family History/Social History/Meds/Allergies:    Past Medical History:  Diagnosis Date   Back pain    Cancer (HCC)    Past Surgical History:  Procedure Laterality Date   LUMBAR DISC SURGERY     x2, L4-L5   PATELLAR TENDON REPAIR Bilateral    TOTAL ABDOMINAL HYSTERECTOMY  10/2018   Social History   Socioeconomic History   Marital status: Single    Spouse name: Not on file   Number of children: Not on file   Years of  education: Not on file   Highest education level: Master's degree (e.g., MA, MS, MEng, MEd, MSW, MBA)  Occupational History   Not on file  Tobacco Use   Smoking status: Never   Smokeless tobacco: Never  Substance and Sexual Activity   Alcohol use: Yes    Alcohol/week: 2.0 - 3.0 standard drinks of alcohol    Types: 2 - 3 Standard drinks or equivalent per week   Drug use: Never   Sexual activity: Yes    Birth control/protection: Other-see comments    Comment: same sex partner  Other Topics Concern   Not on file  Social History Narrative   Not on file   Social Drivers of Health   Tobacco Use: Low Risk (06/11/2024)   Patient History    Smoking Tobacco Use: Never    Smokeless Tobacco Use: Never    Passive Exposure: Not on file  Financial Resource Strain: High Risk (03/27/2024)   Overall Financial Resource Strain (CARDIA)    Difficulty of Paying Living Expenses: Hard  Food Insecurity: Food Insecurity Present (03/27/2024)   Epic    Worried About Programme Researcher, Broadcasting/film/video in the Last Year: Sometimes true    [employer]  in the Last Year: Never true  Transportation Needs: No Transportation Needs (03/27/2024)   Epic    Lack of Transportation (Medical): No    Lack of Transportation (Non-Medical): No  Physical Activity: Insufficiently Active (03/27/2024)   Exercise Vital Sign    Days of Exercise per Week: 1 day    Minutes of Exercise per Session: 30 min  Stress: Stress Concern Present (03/27/2024)   [employer] of Occupational Health - Occupational Stress Questionnaire    Feeling of Stress: Very much  Social Connections: Moderately Integrated (03/27/2024)   Social Connection and Isolation Panel    Frequency of Communication with Friends and Family: Three times a week    Frequency of Social Gatherings with Friends and Family: Twice a week    Attends Religious Services: More than 4 times per year    Active Member of Clubs or Organizations: Yes    Attends Banker  Meetings: More than 4 times per year    Marital Status: Never married  Depression (PHQ2-9): Medium Risk (06/11/2024)   Depression (PHQ2-9)    PHQ-2 Score: 6  Alcohol Screen: Low Risk (03/27/2024)   Alcohol Screen    Last Alcohol Screening Score (AUDIT): 1  Housing: Unknown (03/27/2024)   Epic    Unable to Pay for Housing in the Last Year: No    Number of Times Moved in the Last Year: Not on file    Homeless in the Last Year: No  Utilities: Not on file  Health Literacy: Not on file   No family history on file. No Known Allergies Current Outpatient Medications  Medication Sig Dispense Refill   DULoxetine  (CYMBALTA ) 30 MG capsule Take 1 capsule (30 mg total) by mouth daily. NEEDS APPOINTMENT FOR FURTHER REFILLS. 30 capsule 0   meloxicam  (MOBIC ) 15 MG tablet One tab PO qAM with a meal for 2 weeks, then daily prn pain. 90 tablet 3   Naltrexone -buPROPion  HCl ER 8-90 MG TB12 1 tab daily for week 1, then 1 tab BID for week 2, then 2 tab PO qAM and 1 tab PO qPM for week 3, then 2 tabs BID. 120 tablet 0   ondansetron  (ZOFRAN -ODT) 4 MG disintegrating tablet Take 1 tablet (4 mg total) by mouth every 8 (eight) hours as needed for nausea or vomiting. 20 tablet 0   pantoprazole  (PROTONIX ) 40 MG tablet Take 1 tablet (40 mg total) by mouth daily. 90 tablet 0   No current facility-administered medications for this visit.   No results found.  Review of Systems:   A ROS was performed including pertinent positives and negatives as documented in the HPI.  Physical Exam :   Constitutional: NAD and appears stated age Neurological: Alert and oriented Psych: Appropriate affect and cooperative There were no vitals taken for this visit.   Comprehensive Musculoskeletal Exam:    Right knee demonstrates presence of a mild effusion.  No obvious deformity, erythema, or ecchymosis.  No laxity with varus or valgus stress.  Active range of motion from 0 to 110 degrees.  Negative McMurray. Tenderness in the right  thumb mainly over the MCP joint.  Negative CMC grind test.  Significantly decreased pinching strength with hyperextension noted at the MCP.  There is some laxity with varus and valgus stress at the MCP as well.  Mild thenar atrophy with negative Tinel's test.  Imaging:   Xray (right hand 3 views): Negative for acute abnormality.  Hyperextension and moderate degenerative changes seen at the MCP joint with mild  CMC arthritis.  Subluxation of first [facility] joint seen on oblique view.   I personally reviewed and interpreted the radiographs.   Assessment:   48 y.o. female with increased right knee pain after an episode of her knee locking 2 weeks ago.  She was seen in clinic 2 months ago for an aspiration of 40 cc followed by cortisone injection and she did get some relief with this until this locking episode.  X-rays on 10/27 showed mild to moderate degenerative changes, therefore considering if this is an arthritis exacerbation versus potential internal derangement.  Discussed that it is too soon to repeat an injection at this time, however could consider continued conservative management versus further investigation with an MRI.  Patient would like to continue with meloxicam  and icing at this time and reassess in a few weeks.  In terms of her right thumb, she does have notable weakness with grasping and pinching.  I did note some mild thenar atrophy compared to contralateral side on exam however Tinel's test is negative and she does not present with any other symptoms along the median nerve distribution.  Suspect that hyperextension in the MCP joint could be due to underlying CMC instability given arthritis is not significantly progressed and have recommended bracing for initial treatment.  Can place a referral to Dr. Agarwala for further evaluation and management.   Plan :    - Follow-up within 4 weeks if knee pain does not improve - Consider referral to Dr. Agarwala for further evaluation of her  thumb     I personally saw and evaluated the patient, and participated in the management and treatment plan.  Leonce Reveal, PA-C Orthopedics "

## 2024-08-12 ENCOUNTER — Encounter (HOSPITAL_BASED_OUTPATIENT_CLINIC_OR_DEPARTMENT_OTHER): Payer: Self-pay

## 2024-08-12 DIAGNOSIS — M79644 Pain in right finger(s): Secondary | ICD-10-CM

## 2024-08-21 NOTE — Progress Notes (Unsigned)
 "  Tammie Jones - 49 y.o. female MRN [id number]  Date of birth: Dec 14, 1975  Office Visit Note: Visit Date: 08/22/2024 PCP: Willo Mini, NP Referred by: Willo Mini, NP  Subjective: No chief complaint on file.  HPI: Tammie Jones is a pleasant 49 y.o. female who presents today for evaluation of right hand and thumb stiffness with associated numbness and tingling in the bilateral hands with mostly nocturnal symptoms.  She states she does have notable laxity of her joints in general.  Denies any significant injury to the right thumb however she did play basketball extensively when she was younger and states she may have injured the thumb at some point without realizing.  She has been sent to me today by Leonce Reveal, PA for specific hand surgical evaluation.  Pertinent ROS were reviewed with the patient and found to be negative unless otherwise specified above in HPI.   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 Numbness and tingling in both hands   2. Pain in thumb joint with movement, left     Plan: Based on her clinical examination today, and concern for potential chronic right thumb ulnar collateral ligament injury given the severe laxity with stress testing in that region and no firm endpoint.  As far as the numbness and tingling in her hands with nocturnal symptoms, this appears to be consistent with bilateral carpal tunnel syndrome.  She does have notable hypermobility to the joints with notable subluxation of the [facility] at the right thumb seen clinically and radiographically.  We did discuss the underlying issue with hypermobility at length today.  For workup purposes, I recommended that she undergo  a right thumb MRI in order to better delineate potential ulnar collateral ligament injury, we can also look at the health and quality of the thumb CMC joint as well.  As far as the numbness and tingling, I will arrange for electrodiagnostic studies of the bilateral upper extremities to better delineate site and severity of potential peripheral nerve compression.  For the time being, she is encouraged to continue utilizing wrist braces at night for her nocturnal symptoms and activity modification.  She expressed full understanding, will return to me after the MRI and electrodiagnostic studies are complete.  I spent 30 minutes in the care of this patient today including review of previous documentation, imaging obtained, face-to-face time discussing all options regarding treatment and documenting the encounter.    Follow-up: No follow-ups on file.   Meds & Orders: No orders of the defined types were placed in this encounter.   Orders Placed This Encounter  Procedures   XR Hand Complete Left   MR FINGERS RICHT WO CONTRAST   Ambulatory referral to Physical Medicine Rehab     Procedures: No procedures performed      Clinical History: No specialty comments available.  She reports that she has never smoked. She has never used smokeless tobacco.  Recent Labs    03/27/24 1051  HGBA1C 5.6    Objective:   Vital Signs: There were no vitals taken for this visit.  Physical Exam  Gen: Well-appearing, in no acute distress; non-toxic CV: Regular Rate. Well-perfused. Warm.  Resp: Breathing unlabored on room air; no wheezing. Psych: Fluid speech in conversation; appropriate affect;  normal thought process  Ortho Exam PHYSICAL EXAM:  General: Patient is well appearing and in no distress.  Skin and Muscle: No significant skin changes are apparent to hands.  Muscle bulk and contour normal, mild thenar atrophy right side.  Range of Motion and Palpation Tests: Mobility is full about the  elbows with flexion and extension.  Forearm supination and pronation are 85/85 bilaterally.  Wrist flexion/extension is 75/65 bilaterally.  Digital flexion and extension are full.  Thumb opposition is limited at the right thumb, able to achieve opposition to the small finger distal phalanx.  Thumb opposition is full left side.  Notable laxity is seen to the thumb CMC intervals bilaterally  Notable laxity with radial stress testing of the thumb MCP joint at both neutral and 30 degrees of flexion, no significant endpoint in comparison to contralateral side  Neurologic, Vascular, Motor: Sensation is slightly diminished to light touch in the bilateral median distributions.  Tinel's testing positive bilateral carpal tunnel  Phalen's positive bilateral, Derkan's compression positive bilateral  Fingers pink and well perfused.  Capillary refill is brisk.      Lab Results  Component Value Date   HGBA1C 5.6 03/27/2024      Imaging: No results found.  Past Medical/Family/Surgical/Social History: Medications & Allergies reviewed per EMR, new medications updated. Patient Active Problem List   Diagnosis Date Noted   History of ovarian cancer 10/07/2023   Lynch syndrome 10/07/2023   Clavicle enlargement 10/05/2023   Electrocardiogram showing T wave abnormalities 04/21/2022   DDD (degenerative disc disease), lumbar 07/10/2021   Bilateral lower extremity edema 12/15/2020   Numbness and tingling of both legs below knees 12/15/2020   Right knee pain 06/30/2020   Golfer's elbow, right 06/30/2020   Pain of left upper extremity 03/18/2020   Carpal tunnel syndrome of right wrist 03/18/2020   Numbness and tingling in both hands 01/22/2020   Cervical radiculopathy 05/22/2019   Bacterial vaginosis 05/30/2018   Past Medical History:  Diagnosis Date   Back pain    Cancer (HCC)    No family history on file. Past Surgical History:  Procedure Laterality Date   LUMBAR DISC SURGERY     x2, L4-L5    PATELLAR TENDON REPAIR Bilateral    TOTAL ABDOMINAL HYSTERECTOMY  10/2018   Social History   Occupational History   Not on file  Tobacco Use   Smoking status: Never   Smokeless tobacco: Never  Substance and Sexual Activity   Alcohol use: Yes    Alcohol/week: 2.0 - 3.0 standard drinks of alcohol    Types: 2 - 3 Standard drinks or equivalent per week   Drug use: Never   Sexual activity: Yes    Birth control/protection: Other-see comments    Comment: same sex partner    Angely Dietz Estela) Arlinda, M.D. [facility], Hand Surgery  "

## 2024-08-22 ENCOUNTER — Other Ambulatory Visit: Payer: Self-pay

## 2024-08-22 ENCOUNTER — Ambulatory Visit: Admitting: Orthopedic Surgery

## 2024-08-22 DIAGNOSIS — R202 Paresthesia of skin: Secondary | ICD-10-CM

## 2024-08-22 DIAGNOSIS — M25542 Pain in joints of left hand: Secondary | ICD-10-CM | POA: Diagnosis not present

## 2024-08-22 DIAGNOSIS — R200 Anesthesia of skin: Secondary | ICD-10-CM | POA: Diagnosis not present

## 2024-08-29 ENCOUNTER — Telehealth: Payer: Self-pay | Admitting: Orthopedic Surgery

## 2024-08-29 NOTE — Telephone Encounter (Signed)
 Pt called wanting to know if she should get an MRI of both hands. She is starting to have the same issues with the other hand. Call back number is [phone].

## 2024-09-05 ENCOUNTER — Other Ambulatory Visit: Payer: Self-pay

## 2024-09-05 DIAGNOSIS — R200 Anesthesia of skin: Secondary | ICD-10-CM

## 2024-09-20 ENCOUNTER — Other Ambulatory Visit

## 2024-09-26 ENCOUNTER — Encounter: Admitting: Physical Medicine and Rehabilitation
